# Patient Record
Sex: Female | Born: 1985 | Race: White | Hispanic: No | Marital: Married | Smoking: Never smoker
Health system: Southern US, Community
[De-identification: ages and names within clinical notes are randomized; demographics above are authoritative.]

## PROBLEM LIST (undated history)

## (undated) DIAGNOSIS — B009 Herpesviral infection, unspecified: Secondary | ICD-10-CM

## (undated) HISTORY — DX: Herpesviral infection, unspecified: B00.9

## (undated) HISTORY — PX: APPENDECTOMY: SHX54

## (undated) HISTORY — PX: TONSILLECTOMY: SUR1361

---

## 2018-11-01 ENCOUNTER — Other Ambulatory Visit: Payer: Self-pay

## 2018-11-01 ENCOUNTER — Emergency Department (HOSPITAL_COMMUNITY)
Admission: EM | Admit: 2018-11-01 | Discharge: 2018-11-01 | Disposition: A | Discharge status: Home / Self Care | Payer: BLUE CROSS/BLUE SHIELD | Attending: Emergency Medicine | Admitting: Emergency Medicine

## 2018-11-01 ENCOUNTER — Emergency Department (HOSPITAL_COMMUNITY): Payer: BLUE CROSS/BLUE SHIELD

## 2018-11-01 ENCOUNTER — Encounter (HOSPITAL_COMMUNITY): Payer: Self-pay

## 2018-11-01 DIAGNOSIS — M542 Cervicalgia: Secondary | ICD-10-CM | POA: Diagnosis not present

## 2018-11-01 DIAGNOSIS — M5410 Radiculopathy, site unspecified: Secondary | ICD-10-CM | POA: Insufficient documentation

## 2018-11-01 DIAGNOSIS — R200 Anesthesia of skin: Secondary | ICD-10-CM | POA: Diagnosis not present

## 2018-11-01 DIAGNOSIS — R202 Paresthesia of skin: Secondary | ICD-10-CM | POA: Insufficient documentation

## 2018-11-01 DIAGNOSIS — M25512 Pain in left shoulder: Secondary | ICD-10-CM | POA: Diagnosis present

## 2018-11-01 DIAGNOSIS — Z5320 Procedure and treatment not carried out because of patient's decision for unspecified reasons: Secondary | ICD-10-CM | POA: Diagnosis not present

## 2018-11-01 MED ORDER — KETOROLAC TROMETHAMINE 30 MG/ML IJ SOLN
30.0000 mg | Freq: Once | INTRAMUSCULAR | Status: AC
  Administered 2018-11-01: 20:00:00 30 mg via INTRAMUSCULAR
  Filled 2018-11-01: qty 1

## 2018-11-01 MED ORDER — PREDNISONE 20 MG PO TABS
60.0000 mg | ORAL_TABLET | Freq: Once | ORAL | Status: AC
  Administered 2018-11-01: 20:00:00 60 mg via ORAL
  Filled 2018-11-01: qty 3

## 2018-11-01 NOTE — ED Provider Notes (Signed)
[facility] Note   CSN: [id number] Arrival date & time: 11/01/18  1759    History   Chief Complaint No chief complaint on file.   HPI Terri Manning is a 33 y.o. female.     HPI   Pt is a 33 y/o female with no PMHx who presents to the ED today c/o left shoulder pain for that last 6 weeks.  States that over the last 72 hours her symptoms have seemed to become a lot worse and she now has pain that radiates up into the trapezius muscle, left chest wall, and neck.   She denies any substernal chest pain or shortness of breath.  States that it feels like a musculoskeletal pain.  Reports tingling and intermittent numbness to the left upper extremity.  States that her pain is so severe that it sometimes interferes with her daily activities.  She does feel like her left upper extremity has been weaker than normal however she has been able to do most of her activities. States that it seems like pain is limiting her activities more than weakness.   No recent falls or injuries.  No fevers. Sxs started when she started doing new exercises.   She has tried using ice, rest, ibuprofen  Pt has seen her primary clinician for this and was referred to orthopedics.  She was evaluated by Dr. Mina MarbleWeingold and was scheduled for MRI of the shoulder which has yet to take place.  Reviewed note from this encounter.  She received therapeutic/diagnostic injection of the left shoulder subacromial space at that visit.  She states that following this injection she has had no improvement of her symptoms.  No past medical history on file.  There are no active problems to display for this patient.    OB History   No obstetric history on file.      Home Medications    Prior to Admission medications   Medication Sig Start Date End Date Taking? Authorizing Provider  amphetamine-dextroamphetamine (ADDERALL) 10 MG tablet Take 10 mg by mouth daily as needed (ADHD).   09/28/18  Yes [provider]  norethindrone (MICRONOR) 0.35 MG tablet Take 1 tablet by mouth daily. 09/01/18  Yes [provider]    Family History No family history on file.  Social History Social History   Tobacco Use  . Smoking status: Never Smoker  . Smokeless tobacco: Never Used  Substance Use Topics  . Alcohol use: Yes    Alcohol/week: 2.0 standard drinks    Types: 2 Glasses of wine per week  . Drug use: Not on file     Allergies   Ceclor [cefaclor] and Erythromycin   Review of Systems Review of Systems  Constitutional: Negative for chills and fever.  HENT: Negative for ear pain and sore throat.   Eyes: Negative for visual disturbance.  Respiratory: Negative for cough and shortness of breath.   Cardiovascular: Negative for palpitations.       Chest wall pain  Gastrointestinal: Negative for abdominal pain and vomiting.  Genitourinary: Negative for dysuria and hematuria.  Musculoskeletal: Positive for neck pain.       Left shoulder/arm pain  Skin: Negative for rash.  Neurological: Positive for weakness and numbness. Negative for headaches.       Paresthesias  All other systems reviewed and are negative.    Physical Exam Updated Vital Signs BP 111/85 (BP Location: Right Arm)   Pulse 79   Temp 98.4 F (  36.9 C) (Oral)   Resp 16   Ht 5\' 9"  (1.753 m)   Wt 59 kg   LMP 10/30/2018   SpO2 100%   BMI 19.20 kg/m   Physical Exam Vitals signs and nursing note reviewed.  Constitutional:      General: She is not in acute distress.    Appearance: She is well-developed.  HENT:     Head: Normocephalic and atraumatic.  Eyes:     Conjunctiva/sclera: Conjunctivae normal.  Neck:     Musculoskeletal: Neck supple.  Cardiovascular:     Rate and Rhythm: Normal rate and regular rhythm.     Heart sounds: No murmur.  Pulmonary:     Effort: Pulmonary effort is normal. No respiratory distress.     Breath sounds: Normal breath sounds.  Abdominal:      Palpations: Abdomen is soft.     Tenderness: There is no abdominal tenderness.  Musculoskeletal:     Comments: No midline cervical spine TTP.  TTP of the left cervical paraspinous muscles and left trapezius muscles.  Also with TTP to the anterior aspect of the left shoulder.  Reports decreased sensation to dorsal and ventral aspects of the upper arm and to the ventral aspect of the forearm. Does have 5/5 strength to BUE.  Equal grip strength bilaterally.  Thumb extension and pincer strength symmetric and strong bilaterally.  Radial/ulnar pulses intact.  Skin:    General: Skin is warm and dry.  Neurological:     Mental Status: She is alert.      ED Treatments / Results  Labs (all labs ordered are listed, but only abnormal results are displayed) Labs Reviewed - No data to display  EKG EKG Interpretation  Date/Time:  Wednesday Nov 01 2018 21:06:01 EDT Ventricular Rate:  79 PR Interval:  136 QRS Duration: 86 QT Interval:  368 QTC Calculation: 421 R Axis:   77 Text Interpretation:  Normal sinus rhythm Cannot rule out Anterior infarct , age undetermined Abnormal ECG Confirmed by Pricilla Loveless [phone]) on 11/01/2018 9:14:23 PM   Radiology Dg Cervical Spine Complete  Result Date: 11/01/2018 CLINICAL DATA:  Left neck and shoulder pain over the last 6 weeks. EXAM: CERVICAL SPINE - COMPLETE 4+ VIEW COMPARISON:  None. FINDINGS: There is no evidence of cervical spine fracture or prevertebral soft tissue swelling. Alignment is normal. No other significant bone abnormalities are identified. IMPRESSION: Normal cervical spine radiographs. Electronically Signed   By: Paulina Fusi M.D.   On: 11/01/2018 20:41   Dg Shoulder Left  Result Date: 11/01/2018 CLINICAL DATA:  Left shoulder and neck pain. EXAM: LEFT SHOULDER - 2+ VIEW COMPARISON:  None. FINDINGS: There is no evidence of fracture or dislocation. There is no evidence of arthropathy or other focal bone abnormality. Soft tissues are  unremarkable. IMPRESSION: Normal Electronically Signed   By: Paulina Fusi M.D.   On: 11/01/2018 20:41    Procedures Procedures (including critical care time)  Medications Ordered in ED Medications  ketorolac (TORADOL) 30 MG/ML injection 30 mg (30 mg Intramuscular Given 11/01/18 1932)  predniSONE (DELTASONE) tablet 60 mg (60 mg Oral Given 11/01/18 1931)     Initial Impression / Assessment and Plan / ED Course  I have reviewed the triage vital signs and the nursing notes.  Pertinent labs & imaging results that were available during my care of the patient were reviewed by me and considered in my medical decision making (see chart for details).     Final Clinical Impressions(s) / ED  Diagnoses   Final diagnoses:  Radiculopathy, unspecified spinal region   Pt is a 33 y/o female c/o left shoulder pain for that last 6 weeks, worse over the last 72 hours and associated with paresthesias to the left upper extremity.  Pain radiates to the chest wall, axilla, trapezius and neck.  Reports significant pain with ADLs. Pt initially had complained of weakness, however on further questioning it seems that her activities are limited by pain rather than true weakness.  Has been seen by Ortho and is scheduled for MRI of the left shoulder in 8 days.  On exam she has FROM of the BUE without difficulty. She does have sensory changes to the LUE but I do not appreciate any weakness or asymmetry with strength. Distal pulses are intact.  We will obtain x-ray of the cervical spine and left shoulder. X-ray of the cervical spine does not show any degenerative changes or bone spurring.  Joint spaces are preserved. X-ray of the left shoulder also does not show any evidence of arthritis, bone spurring or other abnormality.  EKG obtained which showed NSR. I do not suspect cardiac/pulm etiology of sxs, this seems very MSK related.  Reassessed patient after Toradol and prednisone she states that she does feel some  improvement.  States she no longer has paresthesias, just states that she has pain to the left upper extremity.  I discussed findings of the x-rays.  I discussed the plan to give Rx for prednisone and advised her to continue anti-inflammatories.  Upon this discussion, patient became somewhat upset and was requesting MRI.  I discussed that because I am not observing any acute neurologic deficit that an emergent MRI is not indicated in the ED, and that at this time she is appropriate for outpatient follow up.  She requested to talk to my attending, Dr. Penni Homans. I informed SP who agreed to see patient. On returning to her room to inform pt of this, she had eloped from the ED.  ED Discharge Orders    None       Rayne Du 11/01/18 2138    Pricilla Loveless, MD 11/02/18 1626

## 2018-11-01 NOTE — ED Triage Notes (Signed)
6 week hx of L shoulder pain radiating into chest and progressive numbness and tingling, difficulty holding things and decreased ROM.  Burning pain to elbow and forearm intermittently.  Used ice, rest and ibu without relief.  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  Denies SOB, no N/V/D, no cough.

## 2018-11-01 NOTE — ED Notes (Signed)
Patient eloped

## 2018-11-09 ENCOUNTER — Other Ambulatory Visit: Payer: Self-pay

## 2018-11-09 ENCOUNTER — Ambulatory Visit (INDEPENDENT_AMBULATORY_CARE_PROVIDER_SITE_OTHER): Payer: BLUE CROSS/BLUE SHIELD | Admitting: Neurology

## 2018-11-09 DIAGNOSIS — R202 Paresthesia of skin: Secondary | ICD-10-CM | POA: Diagnosis not present

## 2018-11-09 NOTE — Procedures (Signed)
[facility] Neurology  [address] [place], [address]  [place], [place] [address] Tel: [phone] Fax:  [phone] Test Date:  11/09/2018  Patient: Terri Manning DOB: 07/15/1985 Physician: Nita Sickle, DO  Sex: Female Height: 5\' 9"  Ref Phys: Dairl Ponder, MD  ID#: [id number] Temp: 33.0C Technician:    Patient Complaints: This is a 33 year old female with left arm pain and paresthesias concerning for brachial plexus injury.  NCV & EMG Findings: Extensive electrodiagnostic testing of the left upper extremity and additional studies of the right shows:  1. All sensory responses including left median, left ulnar, bilateral radial, bilateral medial and lateral antebrachial cutaneous sensory responses are symmetric and within normal limits. 2. Left median and ulnar motor responses are within normal limits 3. There is no evidence of active or chronic motor axonal loss changes affecting any of the tested muscles.  Motor unit configuration and recruitment pattern is within normal limits.  Impression: This is a normal study of the left upper extremity.  In particular, there is no evidence of a brachial plexopathy, cervical radiculopathy, or entrapment neuropathy.   ___________________________ Nita Sickle, DO    Nerve Conduction Studies Anti Sensory Summary Table   Site NR Peak (ms) Norm Peak (ms) P-T Amp (V) Norm P-T Amp  Left Lat Ante Brach Cutan Anti Sensory (Lat Forearm)  Lat Biceps    1.5 <2.9 35.1 >14  Right Lat Ante Brach Cutan Anti Sensory (Lat Forearm)  Lat Biceps    2.0 <2.9 27.9 >14  Left Med Ante Brach Cutan Anti Sensory (Med Forearm)  Elbow    1.8  21.0   Right Med Ante Brach Cutan Anti Sensory (Med Forearm)  Elbow    2.1  24.5   Left Median Anti Sensory (2nd Digit)  Wrist    3.1 <3.4 57.6 >20  Left Radial Anti Sensory (Base 1st Digit)  Wrist    2.1 <2.7 48.1 >18  Right Radial Anti Sensory (Base 1st Digit)  Wrist    2.6 <2.7 49.0 >18  Left Ulnar Anti Sensory (5th  Digit)  Wrist    2.9 <3.1 54.5 >12   Motor Summary Table   Site NR Onset (ms) Norm Onset (ms) O-P Amp (mV) Norm O-P Amp Site1 Site2 Delta-0 (ms) Dist (cm) Vel (m/s) Norm Vel (m/s)  Left Median Motor (Abd Poll Brev)  Wrist    3.0 <3.9 9.5 >6 Elbow Wrist 4.5 27.0 60 >50  Elbow    7.5  9.3         Left Ulnar Motor (Abd Dig Minimi)  Wrist    2.3 <3.1 10.8 >7 B Elbow Wrist 3.8 23.0 61 >50  B Elbow    6.1  10.5  A Elbow B Elbow 1.8 10.0 56 >50  A Elbow    7.9  10.5          EMG   Side Muscle Ins Act Fibs Psw Fasc Number Recrt Dur Dur. Amp Amp. Poly Poly. Comment  Left 1stDorInt Nml Nml Nml Nml Nml Nml Nml Nml Nml Nml Nml Nml N/A  Left Ext Indicis Nml Nml Nml Nml Nml Nml Nml Nml Nml Nml Nml Nml N/A  Left PronatorTeres Nml Nml Nml Nml Nml Nml Nml Nml Nml Nml Nml Nml N/A  Left Biceps Nml Nml Nml Nml Nml Nml Nml Nml Nml Nml Nml Nml N/A  Left Triceps Nml Nml Nml Nml Nml Nml Nml Nml Nml Nml Nml Nml N/A  Left Deltoid Nml Nml Nml Nml Nml Nml Nml  Nml Nml Nml Nml Nml N/A  Left Infraspinatus Nml Nml Nml Nml Nml Nml Nml Nml Nml Nml Nml Nml N/A  Left Cervical Parasp Low Nml Nml Nml Nml Nml Nml Nml Nml Nml Nml Nml Nml N/A      Waveforms:

## 2018-11-10 ENCOUNTER — Other Ambulatory Visit: Payer: Self-pay

## 2018-11-10 DIAGNOSIS — R202 Paresthesia of skin: Secondary | ICD-10-CM

## 2018-12-18 ENCOUNTER — Telehealth: Payer: Self-pay | Admitting: Neurology

## 2018-12-18 NOTE — Telephone Encounter (Signed)
Nurse from Dr. Bertis Ruddy office is needing the results faxed to their office for the May EMG. Fax #: [phone]. Thanks!

## 2018-12-18 NOTE — Telephone Encounter (Signed)
Procedure note from 11/09/18 faxed to Dr. Bertis Ruddy office.

## 2018-12-29 ENCOUNTER — Other Ambulatory Visit: Payer: Self-pay

## 2018-12-29 DIAGNOSIS — Z20822 Contact with and (suspected) exposure to covid-19: Secondary | ICD-10-CM

## 2019-01-04 LAB — NOVEL CORONAVIRUS, NAA: SARS-CoV-2, NAA: NOT DETECTED

## 2020-02-22 ENCOUNTER — Other Ambulatory Visit: Payer: Self-pay

## 2020-02-22 ENCOUNTER — Emergency Department (HOSPITAL_COMMUNITY)
Admission: EM | Admit: 2020-02-22 | Discharge: 2020-02-23 | Disposition: A | Discharge status: Home / Self Care | Payer: BC Managed Care – PPO | Attending: Emergency Medicine | Admitting: Emergency Medicine

## 2020-02-22 DIAGNOSIS — Z5321 Procedure and treatment not carried out due to patient leaving prior to being seen by health care provider: Secondary | ICD-10-CM | POA: Insufficient documentation

## 2020-02-22 DIAGNOSIS — R079 Chest pain, unspecified: Secondary | ICD-10-CM | POA: Insufficient documentation

## 2020-02-22 DIAGNOSIS — R5383 Other fatigue: Secondary | ICD-10-CM | POA: Diagnosis not present

## 2020-02-22 DIAGNOSIS — O99893 Other specified diseases and conditions complicating puerperium: Secondary | ICD-10-CM | POA: Diagnosis not present

## 2020-02-22 DIAGNOSIS — Z3A34 34 weeks gestation of pregnancy: Secondary | ICD-10-CM | POA: Insufficient documentation

## 2020-02-22 LAB — CBC
HCT: 37 % (ref 36.0–46.0)
Hemoglobin: 12.3 g/dL (ref 12.0–15.0)
MCH: 32 pg (ref 26.0–34.0)
MCHC: 33.2 g/dL (ref 30.0–36.0)
MCV: 96.4 fL (ref 80.0–100.0)
Platelets: 156 10*3/uL (ref 150–400)
RBC: 3.84 MIL/uL — ABNORMAL LOW (ref 3.87–5.11)
RDW: 12.3 % (ref 11.5–15.5)
WBC: 4 10*3/uL (ref 4.0–10.5)
nRBC: 0 % (ref 0.0–0.2)

## 2020-02-22 LAB — BASIC METABOLIC PANEL
Anion gap: 13 (ref 5–15)
BUN: 6 mg/dL (ref 6–20)
CO2: 20 mmol/L — ABNORMAL LOW (ref 22–32)
Calcium: 9 mg/dL (ref 8.9–10.3)
Chloride: 104 mmol/L (ref 98–111)
Creatinine, Ser: 0.68 mg/dL (ref 0.44–1.00)
GFR calc Af Amer: >60 mL/min (ref >60)
GFR calc non Af Amer: >60 mL/min (ref >60)
Glucose, Bld: 124 mg/dL — ABNORMAL HIGH (ref 70–99)
Potassium: 3.2 mmol/L — ABNORMAL LOW (ref 3.5–5.1)
Sodium: 137 mmol/L (ref 135–145)

## 2020-02-22 LAB — TROPONIN I (HIGH SENSITIVITY): Troponin I (High Sensitivity): <2 ng/L (ref <18)

## 2020-02-22 NOTE — ED Triage Notes (Signed)
Pt reports generalized chest pain since 1200 today. Pain worse when lying down to take a nap and then was worse after waking up. Thinks pain is muscular, as it is painful with inspiration. Reports feeling more fatigued and headache the last two days. [redacted] weeks pregnant.

## 2020-02-22 NOTE — ED Notes (Signed)
Pt called x3 for labs and vitals, no answer.

## 2020-06-21 NOTE — L&D Delivery Note (Signed)
Delivery Note Pt progressed  nicely to C/C/+2.  After a brief 2nd stage, at 1:33 AM a viable female was delivered via Vaginal, Spontaneous (Presentation:   OA   ) in the water.  APGAR: 8, 8; weight pending.   After about 3-4 minutes, the cord was doubly clamped and cut.  Several minutes later, pt assisted out of the tub to the bed.   Placenta status: Spontaneous, Intact.  Cord: 3 vessels with the following complications: None.    Anesthesia: None Episiotomy: None Lacerations: 1st degree Suture Repair:  Est. Blood Loss (mL): 150  Mom to postpartum.  Baby to Couplet care / Skin to Skin  The above by Dr. Karyl Kinnier under my direct supervision.  Terri Manning 05/29/2021, 1:56 AM

## 2020-12-30 LAB — OB RESULTS CONSOLE RUBELLA ANTIBODY, IGM: Rubella: IMMUNE

## 2020-12-30 LAB — OB RESULTS CONSOLE RPR: RPR: NONREACTIVE

## 2021-01-01 LAB — HEPATITIS C ANTIBODY: HCV Ab: NEGATIVE

## 2021-01-01 LAB — HEPATITIS B SURFACE ANTIGEN: Hepatitis B Surface Ag: NEGATIVE

## 2021-01-01 LAB — HM HIV SCREENING LAB: HM HIV Screening: NEGATIVE

## 2021-01-01 LAB — OB RESULTS CONSOLE GC/CHLAMYDIA: Chlamydia: NEGATIVE

## 2021-01-01 LAB — HM HEPATITIS C SCREENING LAB: HM Hepatitis Screen: NEGATIVE

## 2021-01-01 LAB — OB RESULTS CONSOLE HIV ANTIBODY (ROUTINE TESTING): HIV: NONREACTIVE

## 2021-04-07 ENCOUNTER — Ambulatory Visit (INDEPENDENT_AMBULATORY_CARE_PROVIDER_SITE_OTHER): Payer: Medicaid Other | Admitting: Advanced Practice Midwife

## 2021-04-07 ENCOUNTER — Other Ambulatory Visit: Payer: Self-pay

## 2021-04-07 VITALS — BP 112/72 | HR 86 | Wt 170.0 lb

## 2021-04-07 DIAGNOSIS — A6009 Herpesviral infection of other urogenital tract: Secondary | ICD-10-CM | POA: Diagnosis not present

## 2021-04-07 DIAGNOSIS — O98313 Other infections with a predominantly sexual mode of transmission complicating pregnancy, third trimester: Secondary | ICD-10-CM | POA: Diagnosis not present

## 2021-04-07 DIAGNOSIS — Z3480 Encounter for supervision of other normal pregnancy, unspecified trimester: Secondary | ICD-10-CM | POA: Insufficient documentation

## 2021-04-07 DIAGNOSIS — Z3A30 30 weeks gestation of pregnancy: Secondary | ICD-10-CM

## 2021-04-07 DIAGNOSIS — O0990 Supervision of high risk pregnancy, unspecified, unspecified trimester: Secondary | ICD-10-CM | POA: Insufficient documentation

## 2021-04-07 DIAGNOSIS — Z8759 Personal history of other complications of pregnancy, childbirth and the puerperium: Secondary | ICD-10-CM | POA: Diagnosis not present

## 2021-04-07 DIAGNOSIS — O0993 Supervision of high risk pregnancy, unspecified, third trimester: Secondary | ICD-10-CM

## 2021-04-07 DIAGNOSIS — O98319 Other infections with a predominantly sexual mode of transmission complicating pregnancy, unspecified trimester: Secondary | ICD-10-CM | POA: Insufficient documentation

## 2021-04-07 NOTE — Progress Notes (Signed)
Subjective:   Terri Manning is a 35 y.o. [id number] at [redacted]w[redacted]d by midtrimester ultrasound being seen today for her first obstetrical visit.  Her obstetrical history is significant for  NSVD with first pregnancy with macrosomia 10+ lbs, second baby 8lbs without complication.  and has Supervision of high risk pregnancy, antepartum; History of delivery of macrosomal infant; and Genital herpes affecting pregnancy, antepartum on their problem list.. Patient does intend to breast feed. Pregnancy history fully reviewed.  Patient reports no complaints.  HISTORY: OB History  Gravida Para Term Preterm AB Living  4 2 2  0 1 2  SAB IAB Ectopic Multiple Live Births  1 0 0 0 2    # Outcome Date GA Lbr Len/2nd Weight Sex Delivery Anes PTL Lv  4 Current           3 Term 04/17/20 [redacted]w[redacted]d  8 lb (3.629 kg) M Vag-Spont EPI N LIV  2 Term 10/02/12 [redacted]w[redacted]d  10 lb (4.536 kg) M Vag-Spont EPI N LIV  1 SAB            No past medical history on file.  No family history on file. Social History   Tobacco Use   Smoking status: Never   Smokeless tobacco: Never  Substance Use Topics   Alcohol use: Yes    Alcohol/week: 2.0 standard drinks    Types: 2 Glasses of wine per week   Allergies  Allergen Reactions   Ceclor [Cefaclor] Nausea And Vomiting    hallucinations   Erythromycin Hives   Current Outpatient Medications on File Prior to Visit  Medication Sig Dispense Refill   amphetamine-dextroamphetamine (ADDERALL) 10 MG tablet Take 10 mg by mouth daily as needed (ADHD).      norethindrone (MICRONOR) 0.35 MG tablet Take 1 tablet by mouth daily. (Patient not taking: Reported on 04/07/2021)     valACYclovir (VALTREX) 500 MG tablet Take 500 mg by mouth daily. To start at 36 wks (Patient not taking: Reported on 04/07/2021)     No current facility-administered medications on file prior to visit.   Exam   Vitals:   04/07/21 1450  BP: 112/72  Pulse: 86  Weight: 170 lb (77.1 kg)      VS reviewed, nursing  note reviewed,  Constitutional: well developed, well nourished, no distress HEENT: normocephalic CV: normal rate Pulm/chest wall: normal effort Abdomen: soft Neuro: alert and oriented x 3 Skin: warm, dry Psych: affect normal    Assessment:   Pregnancy: 04/09/21 Patient Active Problem List   Diagnosis Date Noted   Supervision of high risk pregnancy, antepartum 04/07/2021   History of delivery of macrosomal infant 04/07/2021   Genital herpes affecting pregnancy, antepartum 04/07/2021     Plan:  1. Supervision of high risk pregnancy, antepartum --Anticipatory guidance about next visits/weeks of pregnancy given. --Next visit in 2 weeks  2. History of delivery of macrosomal infant --First baby 10+ lbs with uncomplicated vaginal delivery, second baby 8 lbs also without complication.   3. Genital herpes affecting pregnancy, antepartum --Last outbreak several years ago. Pt has Rx for Valtrex from [facility] and will take at 36 weeks to prevent outbreaks for delivery.   Reviewed labs from [facility] Continue prenatal vitamins. Reviewed prior genetic screening and 04/09/2021 Problem list reviewed and updated. The nature of [facility] with multiple MDs and other Advanced Practice Providers was explained to patient; also emphasized that residents, students are part of our team. Routine obstetric precautions reviewed.  No follow-ups on file.   Sharen Counter, CNM 04/07/21 5:31 PM

## 2021-04-07 NOTE — Progress Notes (Signed)
OB Transfer 33.6wks Hep C not done at [facility], pended TDAP and flu offered and declined  No complaints.

## 2021-04-22 ENCOUNTER — Encounter: Payer: Medicaid Other | Admitting: Women's Health

## 2021-04-23 ENCOUNTER — Ambulatory Visit (INDEPENDENT_AMBULATORY_CARE_PROVIDER_SITE_OTHER): Payer: Medicaid Other

## 2021-04-23 ENCOUNTER — Other Ambulatory Visit: Payer: Self-pay

## 2021-04-23 ENCOUNTER — Other Ambulatory Visit (HOSPITAL_COMMUNITY)
Admission: RE | Admit: 2021-04-23 | Discharge: 2021-04-23 | Disposition: A | Discharge status: Home / Self Care | Payer: Medicaid Other | Source: Ambulatory Visit

## 2021-04-23 VITALS — BP 106/67 | HR 82 | Wt 171.0 lb

## 2021-04-23 DIAGNOSIS — A6009 Herpesviral infection of other urogenital tract: Secondary | ICD-10-CM

## 2021-04-23 DIAGNOSIS — O4402 Placenta previa specified as without hemorrhage, second trimester: Secondary | ICD-10-CM | POA: Insufficient documentation

## 2021-04-23 DIAGNOSIS — Z3A36 36 weeks gestation of pregnancy: Secondary | ICD-10-CM

## 2021-04-23 DIAGNOSIS — Z3480 Encounter for supervision of other normal pregnancy, unspecified trimester: Secondary | ICD-10-CM

## 2021-04-23 DIAGNOSIS — M6208 Separation of muscle (nontraumatic), other site: Secondary | ICD-10-CM

## 2021-04-23 DIAGNOSIS — O0990 Supervision of high risk pregnancy, unspecified, unspecified trimester: Secondary | ICD-10-CM | POA: Insufficient documentation

## 2021-04-23 DIAGNOSIS — O98319 Other infections with a predominantly sexual mode of transmission complicating pregnancy, unspecified trimester: Secondary | ICD-10-CM

## 2021-04-23 DIAGNOSIS — O43199 Other malformation of placenta, unspecified trimester: Secondary | ICD-10-CM | POA: Insufficient documentation

## 2021-04-23 LAB — OB RESULTS CONSOLE GC/CHLAMYDIA: Gonorrhea: NEGATIVE

## 2021-04-23 MED ORDER — VALACYCLOVIR HCL 500 MG PO TABS: 500.0000 mg | ORAL_TABLET | Freq: Two times a day (BID) | ORAL | 2 refills | Status: DC

## 2021-04-23 NOTE — Progress Notes (Signed)
ROB GBS 

## 2021-04-23 NOTE — Progress Notes (Signed)
LOW-RISK PREGNANCY OFFICE VISIT  Patient name: Terri Manning MRN [id number]  Date of birth: 1985-07-21 Chief Complaint:   Routine Prenatal Visit (GBS)  Subjective:   Terri Manning is a 35 y.o. [phone] female at [redacted]w[redacted]d with an Estimated Date of Delivery: 05/20/21 being seen today for ongoing management of a Low-risk pregnancy aeb has Supervision of high risk pregnancy, antepartum; History of delivery of macrosomal infant; and Genital herpes affecting pregnancy, antepartum on their problem list.  Patient presents today with  symphis pubis pain .  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 She wears a pregnancy belt.  Patient endorses fetal movement. Patient denies abdominal cramping or contractions.   Patient denies vaginal concerns including abnormal discharge, leaking of fluid, and bleeding.  Contractions: Not present. Vag. Bleeding: None.  Movement: Present.  Reviewed past medical,surgical, social, obstetrical and family history as well as problem list, medications and allergies.  Objective   Vitals:   04/23/21 1015  BP: 106/67  Pulse: 82  Weight: 171 lb (77.6 kg)  Body mass index is 25.25 kg/m.  Total Weight Gain:41 lb (18.6 kg)         Physical Examination:   General appearance: Well appearing, and in no distress  Mental status: Alert, oriented to person, place, and time  Skin: Warm & dry  Cardiovascular: Normal heart rate noted  Respiratory: Normal respiratory effort, no distress  Abdomen: Soft, gravid, nontender, AGA with    Pelvic: Cervical exam deferred           Extremities: Edema: None  Fetal Status: Fetal Heart Rate (bpm): 145  Movement: Present   No results found for this or any previous visit (from the past 24 hour(s)).  Assessment & Plan:  Low-risk pregnancy of a 35 y.o., [id number] at [redacted]w[redacted]d with an Estimated Date  of Delivery: 05/20/21   1. Supervision of other normal pregnancy, antepartum -Anticipatory guidance for upcoming appts. -Patient to schedule next appt in 1 weeks for an in-person visit visit. -Review of previous records shows history of placenta previa that resolved at 23 weeks and documented MCI as of 31 weeks. -Will place recommendation for [place] to be discussed at next visit. -No documentation of WB course completion in chart. Provider did not solicit.   2. [redacted] weeks gestation of pregnancy -Addressed concerns. -Discussed some interventions for abdominal and pubis discomfort including warm compresses, rest, and tylenol  usage as needed. -Informed that relief will come after delivery. -Reassured that current symptoms are expected at this stage in pregnancy.  3. Genital herpes affecting pregnancy, antepartum -Rx for Valtrex sent to pharmacy on file.      Meds:  Meds ordered this encounter  Medications   valACYclovir (VALTREX) 500 MG tablet    Sig: Take 1 tablet (500 mg total) by mouth 2 (two) times daily.    Dispense:  90 tablet    Refill:  2    Order Specific Question:   Supervising Provider    Answer:   Reva Bores [[id number]]   Labs/procedures today:  Lab Orders         Culture, beta strep (group b only)       Reviewed: Term labor symptoms and general obstetric precautions including but not limited to vaginal bleeding, contractions, leaking of fluid and fetal movement were reviewed in detail with the patient.  All questions  were answered.  Follow-up: Return in about 1 week (around 04/30/2021) for LROB.  Orders Placed This Encounter  Procedures   Culture, beta strep (group b only)   Cherre Robins MSN, CNM 04/23/2021

## 2021-04-24 LAB — CERVICOVAGINAL ANCILLARY ONLY
Chlamydia: NEGATIVE
Comment: NEGATIVE
Comment: NORMAL
Neisseria Gonorrhea: NEGATIVE

## 2021-04-27 LAB — CULTURE, BETA STREP (GROUP B ONLY): Strep Gp B Culture: NEGATIVE

## 2021-05-01 ENCOUNTER — Other Ambulatory Visit: Payer: Self-pay

## 2021-05-01 ENCOUNTER — Ambulatory Visit (INDEPENDENT_AMBULATORY_CARE_PROVIDER_SITE_OTHER): Payer: Medicaid Other | Admitting: Certified Nurse Midwife

## 2021-05-01 VITALS — BP 116/69 | HR 88 | Wt 175.2 lb

## 2021-05-01 DIAGNOSIS — Z3493 Encounter for supervision of normal pregnancy, unspecified, third trimester: Secondary | ICD-10-CM

## 2021-05-01 DIAGNOSIS — Z3A37 37 weeks gestation of pregnancy: Secondary | ICD-10-CM

## 2021-05-01 DIAGNOSIS — Z3483 Encounter for supervision of other normal pregnancy, third trimester: Secondary | ICD-10-CM

## 2021-05-01 NOTE — Progress Notes (Signed)
   PRENATAL VISIT NOTE  Subjective:  Terri Manning is a 35 y.o. [phone] at [redacted]w[redacted]d being seen today for ongoing prenatal care.  She is currently monitored for the following issues for this low-risk pregnancy and has Supervision of other normal pregnancy, antepartum; History of delivery of macrosomal infant; Genital herpes affecting pregnancy, antepartum; Marginal insertion of umbilical cord affecting management of mother; and Placenta previa antepartum in second trimester on their problem list.  Patient reports no complaints.  Contractions: Irritability. Vag. Bleeding: None.  Movement: Present. Denies leaking of fluid.   The following portions of the patient's history were reviewed and updated as appropriate: allergies, current medications, past family history, past medical history, past social history, past surgical history and problem list.   Objective:   Vitals:   05/01/21 0911  BP: 116/69  Pulse: 88  Weight: 175 lb 3.2 oz (79.5 kg)    Fetal Status: Fetal Heart Rate (bpm): 135 Fundal Height: 37 cm Movement: Present     General:  Alert, oriented and cooperative. Patient is in no acute distress.  Skin: Skin is warm and dry. No rash noted.   Cardiovascular: Normal heart rate noted  Respiratory: Normal respiratory effort, no problems with respiration noted  Abdomen: Soft, gravid, appropriate for gestational age.  Pain/Pressure: Present     Pelvic: Cervical exam deferred        Extremities: Normal range of motion.  Edema: None  Mental Status: Normal mood and affect. Normal behavior. Normal judgment and thought content.   Assessment and Plan:  Pregnancy: [id number] at [redacted]w[redacted]d 1. Supervision of low-risk pregnancy, third trimester - Doing well, feeling regular and vigorous fetal movement   2. [redacted] weeks gestation of pregnancy - Routine OB care - Waterbirth consent completed, discussed possible concern about fetal weight but pt has no history of SD even with the 10lb6oz first baby. She's  pushed for less than with both previous deliveries.  Term labor symptoms and general obstetric precautions including but not limited to vaginal bleeding, contractions, leaking of fluid and fetal movement were reviewed in detail with the patient. Please refer to After Visit Summary for other counseling recommendations.   Return in about 1 week (around 05/08/2021) for IN-PERSON, LOB.  Future Appointments  Date Time Provider Department Center  05/07/2021  3:30 PM Conan Bowens, MD [facility] None    Bernerd Limbo, CNM

## 2021-05-07 ENCOUNTER — Ambulatory Visit (INDEPENDENT_AMBULATORY_CARE_PROVIDER_SITE_OTHER): Payer: Medicaid Other | Admitting: Obstetrics and Gynecology

## 2021-05-07 ENCOUNTER — Encounter: Payer: Self-pay | Admitting: Obstetrics and Gynecology

## 2021-05-07 ENCOUNTER — Other Ambulatory Visit: Payer: Self-pay

## 2021-05-07 VITALS — BP 113/70 | HR 85 | Wt 178.0 lb

## 2021-05-07 DIAGNOSIS — Z3A38 38 weeks gestation of pregnancy: Secondary | ICD-10-CM

## 2021-05-07 DIAGNOSIS — A6009 Herpesviral infection of other urogenital tract: Secondary | ICD-10-CM

## 2021-05-07 DIAGNOSIS — Z3480 Encounter for supervision of other normal pregnancy, unspecified trimester: Secondary | ICD-10-CM

## 2021-05-07 DIAGNOSIS — O98319 Other infections with a predominantly sexual mode of transmission complicating pregnancy, unspecified trimester: Secondary | ICD-10-CM

## 2021-05-07 DIAGNOSIS — Z8759 Personal history of other complications of pregnancy, childbirth and the puerperium: Secondary | ICD-10-CM

## 2021-05-07 DIAGNOSIS — O43199 Other malformation of placenta, unspecified trimester: Secondary | ICD-10-CM

## 2021-05-07 NOTE — Progress Notes (Signed)
   PRENATAL VISIT NOTE  Subjective:  Terri Manning is a 35 y.o. [phone] at [redacted]w[redacted]d being seen today for ongoing prenatal care.  She is currently monitored for the following issues for this low-risk pregnancy and has Supervision of other normal pregnancy, antepartum; History of delivery of macrosomal infant; Genital herpes affecting pregnancy, antepartum; Marginal insertion of umbilical cord affecting management of mother; and Placenta previa antepartum in second trimester on their problem list.  Patient reports occasional contractions.  Contractions: Irregular. Vag. Bleeding: None.  Movement: Present. Denies leaking of fluid.   The following portions of the patient's history were reviewed and updated as appropriate: allergies, current medications, past family history, past medical history, past social history, past surgical history and problem list.   Objective:   Vitals:   05/07/21 1537  BP: 113/70  Pulse: 85  Weight: 178 lb (80.7 kg)    Fetal Status: Fetal Heart Rate (bpm): 150 Fundal Height: 37 cm Movement: Present     General:  Alert, oriented and cooperative. Patient is in no acute distress.  Skin: Skin is warm and dry. No rash noted.   Cardiovascular: Normal heart rate noted  Respiratory: Normal respiratory effort, no problems with respiration noted  Abdomen: Soft, gravid, appropriate for gestational age.  Pain/Pressure: Present     Pelvic: Cervical exam deferred        Extremities: Normal range of motion.  Edema: None  Mental Status: Normal mood and affect. Normal behavior. Normal judgment and thought content.   Assessment and Plan:  Pregnancy: [id number] at [redacted]w[redacted]d  1. History of delivery of macrosomal infant  2. Marginal insertion of umbilical cord affecting management of mother  3. Genital herpes affecting pregnancy, antepartum On valtrex, no symptoms/lesions  4. Supervision of other normal pregnancy, antepartum  5. [redacted] weeks gestation of pregnancy   Term labor  symptoms and general obstetric precautions including but not limited to vaginal bleeding, contractions, leaking of fluid and fetal movement were reviewed in detail with the patient. Please refer to After Visit Summary for other counseling recommendations.   Return in about 1 week (around 05/14/2021) for low OB, in person.  No future appointments.  Conan Bowens, MD

## 2021-05-18 ENCOUNTER — Other Ambulatory Visit: Payer: Self-pay

## 2021-05-18 ENCOUNTER — Ambulatory Visit (INDEPENDENT_AMBULATORY_CARE_PROVIDER_SITE_OTHER): Payer: Medicaid Other | Admitting: Advanced Practice Midwife

## 2021-05-18 VITALS — BP 120/72 | HR 93 | Wt 176.0 lb

## 2021-05-18 DIAGNOSIS — Z3480 Encounter for supervision of other normal pregnancy, unspecified trimester: Secondary | ICD-10-CM

## 2021-05-18 DIAGNOSIS — Z3A39 39 weeks gestation of pregnancy: Secondary | ICD-10-CM

## 2021-05-18 DIAGNOSIS — O43199 Other malformation of placenta, unspecified trimester: Secondary | ICD-10-CM

## 2021-05-18 DIAGNOSIS — Z8759 Personal history of other complications of pregnancy, childbirth and the puerperium: Secondary | ICD-10-CM

## 2021-05-18 NOTE — Progress Notes (Signed)
   PRENATAL VISIT NOTE  Subjective:  Terri Manning is a 35 y.o. [phone] at [redacted]w[redacted]d being seen today for ongoing prenatal care.  She is currently monitored for the following issues for this low-risk pregnancy and has Supervision of other normal pregnancy, antepartum; History of delivery of macrosomal infant; Genital herpes affecting pregnancy, antepartum; Marginal insertion of umbilical cord affecting management of mother; and Placenta previa antepartum in second trimester on their problem list.  Patient reports no complaints.  Contractions: Not present. Vag. Bleeding: None.  Movement: Present. Denies leaking of fluid.   The following portions of the patient's history were reviewed and updated as appropriate: allergies, current medications, past family history, past medical history, past social history, past surgical history and problem list.   Objective:   Vitals:   05/18/21 1338  BP: 120/72  Pulse: 93  Weight: 176 lb (79.8 kg)    Fetal Status: Fetal Heart Rate (bpm): 138 Fundal Height: 38 cm Movement: Present  Presentation: Vertex  General:  Alert, oriented and cooperative. Patient is in no acute distress.  Skin: Skin is warm and dry. No rash noted.   Cardiovascular: Normal heart rate noted  Respiratory: Normal respiratory effort, no problems with respiration noted  Abdomen: Soft, gravid, appropriate for gestational age.  Pain/Pressure: Present     Pelvic: Cervical exam performed in the presence of a chaperone Dilation: 1 Effacement (%): 50 Station: -2  Extremities: Normal range of motion.  Edema: None  Mental Status: Normal mood and affect. Normal behavior. Normal judgment and thought content.   Assessment and Plan:  Pregnancy: [id number] at [redacted]w[redacted]d 1. Supervision of other normal pregnancy, antepartum --Anticipatory guidance about next visits/weeks of pregnancy given. --No barriers to WB --Next visit at 40 weeks for NST, pt Ok with membrane sweep next week, declines today --Visit in  1 week  2. Marginal insertion of umbilical cord affecting management of mother --Growth wnl at 23 and 31 weeks, declined further scans  3. History of delivery of macrosomal infant --first baby 10 lbs, uncomplicated delivery  4. [redacted] weeks gestation of pregnancy   Term labor symptoms and general obstetric precautions including but not limited to vaginal bleeding, contractions, leaking of fluid and fetal movement were reviewed in detail with the patient. Please refer to After Visit Summary for other counseling recommendations.   No follow-ups on file.  Future Appointments  Date Time Provider Department Center  05/25/2021  1:30 PM Leftwich-Kirby, Wilmer Floor, CNM [facility] None     Sharen Counter, CNM

## 2021-05-25 ENCOUNTER — Ambulatory Visit (INDEPENDENT_AMBULATORY_CARE_PROVIDER_SITE_OTHER): Payer: Medicaid Other | Admitting: Advanced Practice Midwife

## 2021-05-25 ENCOUNTER — Other Ambulatory Visit: Payer: Self-pay

## 2021-05-25 VITALS — BP 123/76 | HR 81 | Wt 175.0 lb

## 2021-05-25 DIAGNOSIS — O98313 Other infections with a predominantly sexual mode of transmission complicating pregnancy, third trimester: Secondary | ICD-10-CM

## 2021-05-25 DIAGNOSIS — O480 Post-term pregnancy: Secondary | ICD-10-CM

## 2021-05-25 DIAGNOSIS — O43193 Other malformation of placenta, third trimester: Secondary | ICD-10-CM

## 2021-05-25 DIAGNOSIS — A6009 Herpesviral infection of other urogenital tract: Secondary | ICD-10-CM

## 2021-05-25 DIAGNOSIS — O98319 Other infections with a predominantly sexual mode of transmission complicating pregnancy, unspecified trimester: Secondary | ICD-10-CM

## 2021-05-25 DIAGNOSIS — Z3480 Encounter for supervision of other normal pregnancy, unspecified trimester: Secondary | ICD-10-CM

## 2021-05-25 DIAGNOSIS — O43199 Other malformation of placenta, unspecified trimester: Secondary | ICD-10-CM

## 2021-05-25 DIAGNOSIS — Z3A40 40 weeks gestation of pregnancy: Secondary | ICD-10-CM

## 2021-05-25 NOTE — Progress Notes (Signed)
Pt states ctx have picked over the past couple days.

## 2021-05-25 NOTE — Patient Instructions (Signed)
Labor Precautions Reasons to come to [facility] at [facility]:  1.  Contractions are  5 minutes apart or less, each last 1 minute, these have been going on for 1-2 hours, and you cannot walk or talk during them 2.  You have a large gush of fluid, or a trickle of fluid that will not stop and you have to wear a pad 3.  You have bleeding that is bright red, heavier than spotting--like menstrual bleeding (spotting can be normal in early labor or after a check of your cervix) 4.  You do not feel the baby moving like he/she normally does  

## 2021-05-25 NOTE — Progress Notes (Signed)
   PRENATAL VISIT NOTE  Subjective:  Terri Manning is a 35 y.o. [phone] at [redacted]w[redacted]d being seen today for ongoing prenatal care.  She is currently monitored for the following issues for this low-risk pregnancy and has Supervision of other normal pregnancy, antepartum; History of delivery of macrosomal infant; Genital herpes affecting pregnancy, antepartum; Marginal insertion of umbilical cord affecting management of mother; and Placenta previa antepartum in second trimester on their problem list.  Patient reports  stronger but irregular contractions starting yesterday and continuing today .  Contractions: Irregular. Vag. Bleeding: None.  Movement: Present. Denies leaking of fluid.   The following portions of the patient's history were reviewed and updated as appropriate: allergies, current medications, past family history, past medical history, past social history, past surgical history and problem list.   Objective:   Vitals:   05/25/21 1329  BP: 123/76  Pulse: 81  Weight: 175 lb (79.4 kg)    Fetal Status: Fetal Heart Rate (bpm): NST   Movement: Present  Presentation: Vertex  General:  Alert, oriented and cooperative. Patient is in no acute distress.  Skin: Skin is warm and dry. No rash noted.   Cardiovascular: Normal heart rate noted  Respiratory: Normal respiratory effort, no problems with respiration noted  Abdomen: Soft, gravid, appropriate for gestational age.  Pain/Pressure: Present     Pelvic: Cervical exam performed in the presence of a chaperone Dilation: 3 Effacement (%): 50 Station: -2  Extremities: Normal range of motion.  Edema: None  Mental Status: Normal mood and affect. Normal behavior. Normal judgment and thought content.   Assessment and Plan:  Pregnancy: [id number] at [redacted]w[redacted]d 1. Supervision of other normal pregnancy, antepartum --Anticipatory guidance about next visits/weeks of pregnancy given. --Pt does not desire 41 week IOL --NST reactive today and pt feeling good  fetal movement --Next visit for NST later this week, then prenatal visit in 1 week --Pt declines membrane sweep today, but cervix now 3 cm, change from last week, and pt with more painful irregular contractions so hoping labor is within a few days --Next visit will be at [redacted]w[redacted]d, so will need to schedule induction at that time, pt states understanding and agrees with this plan --Kick counts/warning signs/labor precautions/reasons to go to hospital reviewed  2. Marginal insertion of umbilical cord affecting management of mother --Normal growth [place]  3. Genital herpes affecting pregnancy, antepartum --On suppression  4. Post term pregnancy over 40 weeks --NST reactive  5. [redacted] weeks gestation of pregnancy   Term labor symptoms and general obstetric precautions including but not limited to vaginal bleeding, contractions, leaking of fluid and fetal movement were reviewed in detail with the patient. Please refer to After Visit Summary for other counseling recommendations.   Return in about 1 week (around 06/01/2021).  No future appointments.   Sharen Counter, CNM

## 2021-05-28 ENCOUNTER — Ambulatory Visit (INDEPENDENT_AMBULATORY_CARE_PROVIDER_SITE_OTHER): Payer: Medicaid Other | Admitting: *Deleted

## 2021-05-28 ENCOUNTER — Encounter (HOSPITAL_COMMUNITY): Payer: Self-pay | Admitting: Obstetrics & Gynecology

## 2021-05-28 ENCOUNTER — Other Ambulatory Visit: Payer: Self-pay

## 2021-05-28 ENCOUNTER — Inpatient Hospital Stay (HOSPITAL_COMMUNITY)
Admission: AD | Admit: 2021-05-28 | Discharge: 2021-05-29 | DRG: 806 | Disposition: A | Discharge status: Home / Self Care | Payer: Medicaid Other | Attending: Obstetrics & Gynecology | Admitting: Obstetrics & Gynecology

## 2021-05-28 DIAGNOSIS — Z3A41 41 weeks gestation of pregnancy: Secondary | ICD-10-CM

## 2021-05-28 DIAGNOSIS — Z8759 Personal history of other complications of pregnancy, childbirth and the puerperium: Secondary | ICD-10-CM

## 2021-05-28 DIAGNOSIS — A6009 Herpesviral infection of other urogenital tract: Secondary | ICD-10-CM | POA: Diagnosis present

## 2021-05-28 DIAGNOSIS — O98319 Other infections with a predominantly sexual mode of transmission complicating pregnancy, unspecified trimester: Secondary | ICD-10-CM | POA: Diagnosis present

## 2021-05-28 DIAGNOSIS — O480 Post-term pregnancy: Secondary | ICD-10-CM | POA: Diagnosis present

## 2021-05-28 DIAGNOSIS — O43123 Velamentous insertion of umbilical cord, third trimester: Secondary | ICD-10-CM | POA: Diagnosis present

## 2021-05-28 DIAGNOSIS — O9832 Other infections with a predominantly sexual mode of transmission complicating childbirth: Secondary | ICD-10-CM | POA: Diagnosis present

## 2021-05-28 DIAGNOSIS — A6000 Herpesviral infection of urogenital system, unspecified: Secondary | ICD-10-CM | POA: Diagnosis present

## 2021-05-28 DIAGNOSIS — O43199 Other malformation of placenta, unspecified trimester: Secondary | ICD-10-CM | POA: Diagnosis present

## 2021-05-28 DIAGNOSIS — Z20822 Contact with and (suspected) exposure to covid-19: Secondary | ICD-10-CM | POA: Diagnosis present

## 2021-05-28 DIAGNOSIS — O4402 Placenta previa specified as without hemorrhage, second trimester: Secondary | ICD-10-CM

## 2021-05-28 DIAGNOSIS — Z3480 Encounter for supervision of other normal pregnancy, unspecified trimester: Secondary | ICD-10-CM

## 2021-05-28 DIAGNOSIS — Z3490 Encounter for supervision of normal pregnancy, unspecified, unspecified trimester: Secondary | ICD-10-CM

## 2021-05-28 LAB — COMPREHENSIVE METABOLIC PANEL
ALT: 14 U/L (ref 0–44)
AST: 22 U/L (ref 15–41)
Albumin: 3.4 g/dL — ABNORMAL LOW (ref 3.5–5.0)
Alkaline Phosphatase: 134 U/L — ABNORMAL HIGH (ref 38–126)
Anion gap: 12 (ref 5–15)
BUN: 12 mg/dL (ref 6–20)
CO2: 20 mmol/L — ABNORMAL LOW (ref 22–32)
Calcium: 9.5 mg/dL (ref 8.9–10.3)
Chloride: 103 mmol/L (ref 98–111)
Creatinine, Ser: 0.6 mg/dL (ref 0.44–1.00)
GFR, Estimated: >60 mL/min (ref >60)
Glucose, Bld: 76 mg/dL (ref 70–99)
Potassium: 3.8 mmol/L (ref 3.5–5.1)
Sodium: 135 mmol/L (ref 135–145)
Total Bilirubin: 0.5 mg/dL (ref 0.3–1.2)
Total Protein: 6.6 g/dL (ref 6.5–8.1)

## 2021-05-28 LAB — TYPE AND SCREEN
ABO/RH(D): O POS
Antibody Screen: NEGATIVE

## 2021-05-28 LAB — CBC
HCT: 39.3 % (ref 36.0–46.0)
Hemoglobin: 13.1 g/dL (ref 12.0–15.0)
MCH: 31.1 pg (ref 26.0–34.0)
MCHC: 33.3 g/dL (ref 30.0–36.0)
MCV: 93.3 fL (ref 80.0–100.0)
Platelets: 196 10*3/uL (ref 150–400)
RBC: 4.21 MIL/uL (ref 3.87–5.11)
RDW: 12.2 % (ref 11.5–15.5)
WBC: 7 10*3/uL (ref 4.0–10.5)
nRBC: 0 % (ref 0.0–0.2)

## 2021-05-28 LAB — RESP PANEL BY RT-PCR (FLU A&B, COVID) ARPGX2
Influenza A by PCR: NEGATIVE
Influenza B by PCR: NEGATIVE
SARS Coronavirus 2 by RT PCR: NEGATIVE

## 2021-05-28 MED ORDER — LACTATED RINGERS IV SOLN: 500.0000 mL | INTRAVENOUS | Status: DC | PRN

## 2021-05-28 MED ORDER — OXYCODONE-ACETAMINOPHEN 5-325 MG PO TABS: 2.0000 | ORAL_TABLET | ORAL | Status: DC | PRN

## 2021-05-28 MED ORDER — OXYTOCIN-SODIUM CHLORIDE 30-0.9 UT/500ML-% IV SOLN: 2.5000 [IU]/h | INTRAVENOUS | Status: DC

## 2021-05-28 MED ORDER — OXYCODONE-ACETAMINOPHEN 5-325 MG PO TABS: 1.0000 | ORAL_TABLET | ORAL | Status: DC | PRN

## 2021-05-28 MED ORDER — ONDANSETRON HCL 4 MG/2ML IJ SOLN: 4.0000 mg | Freq: Four times a day (QID) | INTRAMUSCULAR | Status: DC | PRN

## 2021-05-28 MED ORDER — OXYTOCIN BOLUS FROM INFUSION: 333.0000 mL | Freq: Once | INTRAVENOUS | Status: DC

## 2021-05-28 MED ORDER — SOD CITRATE-CITRIC ACID 500-334 MG/5ML PO SOLN: 30.0000 mL | ORAL | Status: DC | PRN

## 2021-05-28 MED ORDER — LIDOCAINE HCL (PF) 1 % IJ SOLN: 30.0000 mL | INTRAMUSCULAR | Status: DC | PRN

## 2021-05-28 MED ORDER — ACETAMINOPHEN 325 MG PO TABS: 650.0000 mg | ORAL_TABLET | ORAL | Status: DC | PRN

## 2021-05-28 MED ORDER — LACTATED RINGERS IV SOLN: INTRAVENOUS | Status: DC

## 2021-05-28 NOTE — Progress Notes (Signed)
Terri Manning is a 35 y.o. [id number] at [redacted]w[redacted]d admitted for induction of labor due to Non-reactive NST.  Subjective: Pt comfortable, no increase in contractions since membrane sweep at previous exam.  Pt has done some walking, resting, and nipple stimulation without change in contractions.   Objective: BP 120/68   Pulse 84   Resp 16   Ht 5\' 9"  (1.753 m)   Wt 79.8 kg   LMP 08/18/2020 (Approximate)   BMI 25.99 kg/m  No intake/output data recorded. No intake/output data recorded.  FHT:  FHR: 135 bpm, variability: moderate,  accelerations:  Present,  decelerations:  Absent UC:   rare SVE:   Dilation: 4 Effacement (%): 70 Station: -2 Exam by:: [email or web address] CNM Attempt to AROM pt unsuccessful  Labs: Lab Results  Component Value Date   WBC 7.0 05/28/2021   HGB 13.1 05/28/2021   HCT 39.3 05/28/2021   MCV 93.3 05/28/2021   PLT 196 05/28/2021    Assessment / Plan: Induction of labor due to non-reassuring fetal testing  Labor:  Unsuccessful attempt to AROM.  Discussed options with pt including having another provider attempt AROM, use of Cytotec, or try nipple stimulation again with some walking in room/hallway.  Pt desires to try walking and/or nipple stimulation at this time. Reviewed FHR tracing with DRs 14/01/2021 and Anyanwu who approve use of intermittent fetal auscultation and waterbirth Ok unless there are any changes.   Preeclampsia:   n/a Fetal Wellbeing:  Category I Pain Control:  Labor support without medications I/D:   GBS neg Anticipated MOD:  NSVD  Shawnie Pons 05/28/2021, 5:18 PM

## 2021-05-28 NOTE — Progress Notes (Signed)
Terri Manning is a 35 y.o. [id number] at [redacted]w[redacted]d admitted for induction of labor due to Non-reactive NST.  Subjective: Pt comfortable, tearful, worried about deceleration in the office. Did not desire induction but knows that there are concerns about baby and is open to plan to begin labor.  Objective: LMP 08/18/2020 (Approximate)  No intake/output data recorded. No intake/output data recorded.  FHT:  FHR: 135 bpm, variability: moderate,  accelerations:  Present,  decelerations:  Absent UC:   rare SVE:   Dilation: 4 Effacement (%): 70 Station: -2 Exam by:: Romelle Starcher CNM Swept pt membranes, pt tolerated well  Labs: Lab Results  Component Value Date   WBC 4.0 02/22/2020   HGB 12.3 02/22/2020   HCT 37.0 02/22/2020   MCV 96.4 02/22/2020   PLT 156 02/22/2020    Assessment / Plan: Induction of labor due to non-reassuring fetal testing,  progressing well on pitocin  Labor:  Discussed options, including starting Pitocin, using  Cytotec to begin cramping/contractions, and/or AROM.  FHR tracing Category I with accelerations since arrival at hospital.  Given normal FHR tracing, discussed option to sweep membranes and have pt walk/shower/etc and recheck in 3-4 hours. Pt agrees with plan to begin with sweep unless FHR dictates otherwise.   Preeclampsia:   n/a Fetal Wellbeing:  Category I Pain Control:  Labor support without medications I/D:   GBS neg Anticipated MOD:  NSVD  Sharen Counter 05/28/2021, 12:25 PM

## 2021-05-28 NOTE — H&P (Signed)
Terri Manning is a 35 y.o. female [phone]  at [redacted]w[redacted]d presenting for IOL for prolonged deceleration on NST in the office 05/28/21.  Pt desires waterbirth and has had low risk pregnancy. She preferred to wait for labor to begin in its own, and had reactive NST at [redacted]w[redacted]d in the office . She was scheduled for NST today, with plan for induction if no labor by [redacted]w[redacted]d.  Today on NST, there was a prolonged deceleration so she was sent to [facility] for induction of labor.      Nursing Staff Provider  Office Location [facility] Dating  U/S 12/29/20 EDD: 05/20/21  Language  English Anatomy US    Flu Vaccine  declined Genetic Screen  NIPS:  declined genetic screening    TDaP Vaccine   declined Hgb A1C or  GTT Early  Third trimester   COVID Vaccine none   LAB RESULTS   Rhogam  N/A Blood Type  O Pos  Feeding Plan breast Antibody  Negative  Contraception condoms Rubella  Immune  Circumcision no RPR  Negative  Pediatrician  [place], [place], [facility] HBsAg Negative (07/14 0000)   Support Person [facility]   Prenatal Classes  Waterbirth HIV  Non Reactive  BTL Consent NA GBS  Negative (For PCN allergy, check sensitivities)   VBAC Consent  Pap 2019, needs pp    Hgb Electro  Negative  BP Cuff  CF   PHQ-9/GAD-7  [X]  36 WEEKS SMA     Waterbirth  [ ]  Class [ ]  Consent [ ]  CNM visit    Induction  [ ]  Orders Entered [ ] Foley Y/N    OB History     Gravida  4   Para  2   Term  2   Preterm  0   AB  1   Living  2      SAB  1   IAB  0   Ectopic  0   Multiple  0   Live Births  2          No past medical history on file. Past Surgical History:  Procedure Laterality Date  . APPENDECTOMY     Family History: family history is not on file. Social History:  reports that she has never smoked. She has never used smokeless tobacco. She reports current alcohol use of about 2.0 standard drinks per week. No history on file for drug use.     Maternal Diabetes: No Genetic Screening:  Declined Maternal Ultrasounds/Referrals: Normal Fetal Ultrasounds or other Referrals:  None Maternal Substance Abuse:  No Significant Maternal Medications:  None Significant Maternal Lab Results:  None Other Comments:   GBS negative  Review of Systems  Constitutional:  Negative for chills, fatigue and fever.  Eyes:  Negative for visual disturbance.  Respiratory:  Negative for shortness of breath.   Cardiovascular:  Negative for chest pain.  Gastrointestinal:  Negative for abdominal pain and vomiting.  Genitourinary:  Negative for difficulty urinating, dysuria, flank pain, pelvic pain, vaginal bleeding, vaginal discharge and vaginal pain.  Neurological:  Negative for dizziness and headaches.  Psychiatric/Behavioral: Negative.    Maternal Medical History:  Reason for admission: Prolonged deceleration on NST   Dilation: 4 Effacement (%): 70 Station: -2 Exam by:: CNM Last menstrual period 08/18/2020.  Maternal Exam:  Uterine Assessment: Contraction strength is mild.  Contraction frequency is rare.  Abdomen: Fetal presentation: vertex Pelvis: adequate for delivery.   Cervix: Cervix evaluated by  digital exam.    Physical Exam Vitals and nursing note reviewed.  Constitutional:      Appearance: She is well-developed.  Cardiovascular:     Rate and Rhythm: Normal rate.  Pulmonary:     Effort: Pulmonary effort is normal.  Abdominal:     Palpations: Abdomen is soft.  Musculoskeletal:        General: Normal range of motion.     Cervical back: Normal range of motion.  Skin:    General: Skin is warm and dry.  Neurological:     Mental Status: She is alert and oriented to person, place, and time.  Psychiatric:        Behavior: Behavior normal.        Thought Content: Thought content normal.        Judgment: Judgment normal.    Prenatal labs: ABO, Rh:  O positive Antibody:  negative Rubella:  immune RPR:   nonreactive HBsAg: Negative (07/14 0000)  HIV:    negative GBS: Negative/-- (11/03 1049)   Assessment/Plan: [id number] at [redacted]w[redacted]d with nonreactive NST  GBS negative  IOL for nonreactive NST NST reactive on admission Continuous EFM on admission Discussed options for IOL and plan to sweep membranes now, consider AROM in 2-3 hours given Category I FHR tracing continues     [employer] 05/28/2021, 12:26 PM

## 2021-05-28 NOTE — Plan of Care (Signed)
  Problem: Education: Goal: Knowledge of Childbirth will improve Outcome: Progressing Goal: Ability to make informed decisions regarding treatment and plan of care will improve Outcome: Progressing Goal: Ability to state and carry out methods to decrease the pain will improve Outcome: Progressing Goal: Individualized Educational Video(s) Outcome: Progressing   Problem: Coping: Goal: Ability to verbalize concerns and feelings about labor and delivery will improve Outcome: Progressing   Problem: Life Cycle: Goal: Ability to make normal progression through stages of labor will improve Outcome: Progressing Goal: Ability to effectively push during vaginal delivery will improve Outcome: Progressing   Problem: Role Relationship: Goal: Will demonstrate positive interactions with the child Outcome: Progressing   Problem: Pain Management: Goal: Relief or control of pain from uterine contractions will improve Outcome: Progressing   Problem: Safety: Goal: Risk of complications during labor and delivery will decrease Outcome: Progressing

## 2021-05-28 NOTE — Progress Notes (Signed)
1035 NST started. 1039  Audible decel, EFM adjusted, decel confirmed. See fetal testing for documentation of FHR. Patient was tilted right, then turned to left side, then placed in hands and knees position during approx 7 min prolonged deceleration of FHR. MD assistance was called for and Dr. Alysia Penna responded. After continued assessment of FHR decision made by Dr. Alysia Penna to transport patient via EMS to labor and delivery for expedited delivery. Faculty attending in labor and delivery notified by Dr. Alysia Penna. EMS called by office manager. Patient was moved to right side lying and fetus tolerated position well. Upon EMS arrival, FHR had remained in the 115-120 bpm range with good variability and occ acceleration. EFM d/c'd. EMS attendants advised to keep patient in right tilt. Patient educated and offered reassurance and support throughout.

## 2021-05-28 NOTE — Progress Notes (Signed)
Patient ID: Terri Manning, female   DOB: Aug 23, 1985, 35 y.o.   MRN: [id number]  Labor Progress Note Terri Manning is a 35 y.o. [id number] at [redacted]w[redacted]d presented for  IOL due to postdates with non reassuring NST with subsequently very reassuring a reactive tracing.    S: feeling strong painful ctcx q 2-3 minutes. Coping well. Partner and doula at bedside.  O:  BP 120/80 (BP Location: Right Arm)   Pulse 85   Temp 97.6 F (36.4 C) (Oral)   Resp 17   Ht 5\' 9"  (1.753 m)   Wt 79.8 kg   LMP 08/18/2020 (Approximate)   BMI 25.99 kg/m  FHR  CVE: Dilation: 5 Effacement (%): 70 Cervical Position: Middle Station: -1 Presentation: Vertex Exam by:: [email or web address] MD   A&P: 35 y.o. 32 [redacted]w[redacted]d  #Labor: Progressing well. AROM at 17:02. Continue to encourage upright laboring and movement. Tub is partially filled.  #Pain: plans for WB #FWB: reassuring- on intermittent monitoring. Last check at 130.  #GBS negative  [redacted]w[redacted]d, MD 9:50 PM

## 2021-05-29 ENCOUNTER — Encounter (HOSPITAL_COMMUNITY): Payer: Self-pay | Admitting: Obstetrics & Gynecology

## 2021-05-29 DIAGNOSIS — Z3A41 41 weeks gestation of pregnancy: Secondary | ICD-10-CM

## 2021-05-29 DIAGNOSIS — O480 Post-term pregnancy: Secondary | ICD-10-CM

## 2021-05-29 LAB — RPR: RPR Ser Ql: NONREACTIVE

## 2021-05-29 MED ORDER — TETANUS-DIPHTH-ACELL PERTUSSIS 5-2.5-18.5 LF-MCG/0.5 IM SUSY: 0.5000 mL | PREFILLED_SYRINGE | Freq: Once | INTRAMUSCULAR | Status: DC

## 2021-05-29 MED ORDER — DOCUSATE SODIUM 100 MG PO CAPS: 100.0000 mg | ORAL_CAPSULE | Freq: Two times a day (BID) | ORAL | Status: DC

## 2021-05-29 MED ORDER — DIBUCAINE (PERIANAL) 1 % EX OINT: 1.0000 "application " | TOPICAL_OINTMENT | CUTANEOUS | Status: DC | PRN

## 2021-05-29 MED ORDER — ONDANSETRON HCL 4 MG PO TABS: 4.0000 mg | ORAL_TABLET | ORAL | Status: DC | PRN

## 2021-05-29 MED ORDER — IBUPROFEN 600 MG PO TABS
600.0000 mg | ORAL_TABLET | Freq: Four times a day (QID) | ORAL | Status: DC
  Administered 2021-05-29: 600 mg via ORAL
  Filled 2021-05-29 (×2): qty 1

## 2021-05-29 MED ORDER — METHYLERGONOVINE MALEATE 0.2 MG/ML IJ SOLN: 0.2000 mg | INTRAMUSCULAR | Status: DC | PRN

## 2021-05-29 MED ORDER — MEASLES, MUMPS & RUBELLA VAC IJ SOLR: 0.5000 mL | Freq: Once | INTRAMUSCULAR | Status: DC

## 2021-05-29 MED ORDER — COCONUT OIL OIL: 1.0000 "application " | TOPICAL_OIL | Status: DC | PRN

## 2021-05-29 MED ORDER — SIMETHICONE 80 MG PO CHEW: 80.0000 mg | CHEWABLE_TABLET | ORAL | Status: DC | PRN

## 2021-05-29 MED ORDER — BISACODYL 10 MG RE SUPP: 10.0000 mg | Freq: Every day | RECTAL | Status: DC | PRN

## 2021-05-29 MED ORDER — FERROUS SULFATE 325 (65 FE) MG PO TABS: 325.0000 mg | ORAL_TABLET | ORAL | 0 refills | Status: AC

## 2021-05-29 MED ORDER — IBUPROFEN 600 MG PO TABS: 600.0000 mg | ORAL_TABLET | Freq: Four times a day (QID) | ORAL | 0 refills | Status: DC | PRN

## 2021-05-29 MED ORDER — BENZOCAINE-MENTHOL 20-0.5 % EX AERO: 1.0000 "application " | INHALATION_SPRAY | CUTANEOUS | Status: DC | PRN

## 2021-05-29 MED ORDER — MEDROXYPROGESTERONE ACETATE 150 MG/ML IM SUSP: 150.0000 mg | INTRAMUSCULAR | Status: DC | PRN

## 2021-05-29 MED ORDER — METHYLERGONOVINE MALEATE 0.2 MG PO TABS: 0.2000 mg | ORAL_TABLET | ORAL | Status: DC | PRN

## 2021-05-29 MED ORDER — ONDANSETRON HCL 4 MG/2ML IJ SOLN: 4.0000 mg | INTRAMUSCULAR | Status: DC | PRN

## 2021-05-29 MED ORDER — FERROUS SULFATE 325 (65 FE) MG PO TABS
325.0000 mg | ORAL_TABLET | ORAL | Status: DC
  Filled 2021-05-29: qty 1

## 2021-05-29 MED ORDER — ACETAMINOPHEN 325 MG PO TABS: 650.0000 mg | ORAL_TABLET | ORAL | Status: DC | PRN

## 2021-05-29 MED ORDER — FLEET ENEMA 7-19 GM/118ML RE ENEM: 1.0000 | ENEMA | Freq: Every day | RECTAL | Status: DC | PRN

## 2021-05-29 MED ORDER — DIPHENHYDRAMINE HCL 25 MG PO CAPS: 25.0000 mg | ORAL_CAPSULE | Freq: Four times a day (QID) | ORAL | Status: DC | PRN

## 2021-05-29 MED ORDER — PRENATAL MULTIVITAMIN CH
1.0000 | ORAL_TABLET | Freq: Every day | ORAL | Status: DC
  Filled 2021-05-29: qty 1

## 2021-05-29 MED ORDER — WITCH HAZEL-GLYCERIN EX PADS: 1.0000 "application " | MEDICATED_PAD | CUTANEOUS | Status: DC | PRN

## 2021-05-29 NOTE — Discharge Instructions (Signed)
[facility]

## 2021-05-29 NOTE — Lactation Note (Signed)
This note was copied from a baby's chart. Lactation Consultation Note  Patient Name: Terri Manning [id number] Date: 05/29/2021 Reason for consult: Term Age:33 hours Per mom, her 2nd child had a lingual frenectomy and she breastfed him until 7 months, he is currently one year old. LC entered the room, mom recently latched infant on her left breast using the cradle hold position, infant latched with depth and was still breastfeeding when LC left the room. Mom knows to breastfeed infant according to primal cues, skin to skin. Mom knows to call RN/LC on MBU if she has any questions, concerns or needs assistance with latching infant at the breast. LC congratulated parents on the birth of their son.  Maternal Data    Feeding Mother's Current Feeding Choice: Breast Milk  LATCH Score Latch: Grasps breast easily, tongue down, lips flanged, rhythmical sucking.  Audible Swallowing: A few with stimulation  Type of Nipple: Everted at rest and after stimulation  Comfort (Breast/Nipple): Soft / non-tender  Hold (Positioning): No assistance needed to correctly position infant at breast.  LATCH Score: 9   Lactation Tools Discussed/Used    Interventions    Discharge    Consult Status Consult Status: Follow-up from L&D    Danelle Earthly 05/29/2021, 2:49 AM

## 2021-05-29 NOTE — Lactation Note (Signed)
This note was copied from a baby's chart. Lactation Consultation Note  Patient Name: Terri Manning [id number] Date: 05/29/2021 Reason for consult: Initial assessment;Term Age:33 hours   P3 mother whose infant is now 88 hours old.  This is a term baby at 41+2 weeks.  Mother breast fed her other two children (now 15 and 27 years old).  Mother's feeding preference is breast.  Lactation order entered at 1015 today.  Baby swaddled and asleep in mother's arms when I arrived.  Reviewed breast feeding basics with parents.  Mother mentioned that her son is very sleepy now but she has been continuing to attempt to feed.  Reassurance provided and encouraged STS and lots of hand expression.  Suggested she feed back any EBM she obtains to baby.    Offered to return for any questions or assistance as needed.  Father present.  RN updated.   Maternal Data Has patient been taught Hand Expression?: Yes Does the patient have breastfeeding experience prior to this delivery?: Yes How long did the patient breastfeed?: Last child for 7 months  Feeding Mother's Current Feeding Choice: Breast Milk  LATCH Score                    Lactation Tools Discussed/Used    Interventions Interventions: Breast feeding basics reviewed;Education  Discharge Pump: Personal  Consult Status Consult Status: Follow-up Date: 05/30/21 Follow-up type: In-patient    Lorynn Moeser R Zaide Mcclenahan 05/29/2021, 10:42 AM

## 2021-05-29 NOTE — Discharge Summary (Signed)
Postpartum Discharge Summary  Date of Service updated     Patient Name: Terri Manning DOB: December 23, 1985 MRN: [id number]  Date of admission: 05/28/2021 Delivery date:05/29/2021  Delivering provider: Caren Macadam  Date of discharge: 05/29/2021  Admitting diagnosis: Pregnancy [Z34.90] Intrauterine pregnancy: [redacted]w[redacted]d    Secondary diagnosis:  Principal Problem:   Pregnancy Active Problems:   History of delivery of macrosomal infant   Genital herpes affecting pregnancy, antepartum   Marginal insertion of umbilical cord affecting management of mother  Additional problems: Delivered water birth    Discharge diagnosis: Term Pregnancy Delivered                                              Post partum procedures:  None  Augmentation: AROM Complications: None  Hospital course: Induction of Labor With Vaginal Delivery   35y.o. yo [id number] admitted to the hospital 05/28/2021 for induction of labor.  Indication for induction:  post dates/HFR decel .  Patient had an uncomplicated labor course as follows: Membrane Rupture Time/Date: 5:02 PM ,05/28/2021   Delivery Method:Vaginal, Spontaneous  Episiotomy: None  Lacerations:  1st degree  Details of delivery can be found in separate delivery note.  Patient had a routine postpartum course. Patient is discharged home 05/29/21.  Newborn Data: Birth date:05/29/2021  Birth time:1:33 AM  Gender:Female  Living status:Living  Apgars:8 ,8  Weight:3175 g   Magnesium Sulfate received: No BMZ received: No Rhophylac:N/A MMR:N/A T-DaP: delcined Flu: No Transfusion:No  Physical exam  Vitals:   05/29/21 0317 05/29/21 0351 05/29/21 0500 05/29/21 1340  BP: (!) 101/49 101/70 118/70 109/64  Pulse: 78 98 66 76  Resp:  '16 16 18  ' Temp:  98.2 F (36.8 C) 98.3 F (36.8 C) 98.1 F (36.7 C)  TempSrc:  Oral Oral Oral  SpO2:   99%   Weight:      Height:       General: alert, cooperative, and no distress Lochia:  appropriate Uterine Fundus: firm Incision: N/A DVT Evaluation: No significant calf/ankle edema. Labs: Lab Results  Component Value Date   WBC 7.0 05/28/2021   HGB 13.1 05/28/2021   HCT 39.3 05/28/2021   MCV 93.3 05/28/2021   PLT 196 05/28/2021   CMP Latest Ref Rng & Units 05/28/2021  Glucose 70 - 99 mg/dL 76  BUN 6 - 20 mg/dL 12  Creatinine 0.44 - 1.00 mg/dL 0.60  Sodium 135 - 145 mmol/L 135  Potassium 3.5 - 5.1 mmol/L 3.8  Chloride 98 - 111 mmol/L 103  CO2 22 - 32 mmol/L 20(L)  Calcium 8.9 - 10.3 mg/dL 9.5  Total Protein 6.5 - 8.1 g/dL 6.6  Total Bilirubin 0.3 - 1.2 mg/dL 0.5  Alkaline Phos 38 - 126 U/L 134(H)  AST 15 - 41 U/L 22  ALT 0 - 44 U/L 14   Edinburgh Score: Edinburgh Postnatal Depression Scale Screening Tool 05/29/2021  I have been able to laugh and see the funny side of things. 0  I have looked forward with enjoyment to things. 0  I have blamed myself unnecessarily when things went wrong. 0  I have been anxious or worried for no good reason. 0  I have felt scared or panicky for no good reason. 0  Things have been getting on top of me. 0  I have been so unhappy that  I have had difficulty sleeping. 0  I have felt sad or miserable. 0  I have been so unhappy that I have been crying. 0  The thought of harming myself has occurred to me. 0  Edinburgh Postnatal Depression Scale Total 0     After visit meds:  Allergies as of 05/29/2021       Reactions   Ceclor [cefaclor] Nausea And Vomiting   hallucinations   Doxycycline Hives   Erythromycin Hives        Medication List     STOP taking these medications    valACYclovir 500 MG tablet Commonly known as: Valtrex       TAKE these medications    acetaminophen 325 MG tablet Commonly known as: Tylenol Take 2 tablets (650 mg total) by mouth every 4 (four) hours as needed (for pain scale < 4).   ferrous sulfate 325 (65 FE) MG tablet Take 1 tablet (325 mg total) by mouth every other day. Start taking  on: May 31, 2021   ibuprofen 600 MG tablet Commonly known as: ADVIL Take 1 tablet (600 mg total) by mouth every 6 (six) hours as needed.   PRENATAL PO Take 1 tablet by mouth daily.         Discharge home in stable condition Infant Feeding: Breast Infant Disposition:home with mother Discharge instruction: per After Visit Summary and Postpartum booklet. Activity: Advance as tolerated. Pelvic rest for 6 weeks.  Diet: routine diet Future Appointments: Future Appointments  Date Time Provider [place]  06/01/2021  1:30 PM Leftwich-Kirby, Kathie Dike, CNM [facility] None   Follow up Visit:  [facility] Follow up.   Contact information: [address] [place] [id number] [phone]                 Please schedule this patient for a Virtual postpartum visit in 4 weeks with the following provider: Any provider. Additional Postpartum F/U:  Low risk pregnancy complicated by:  Delivery mode:  Vaginal, Spontaneous  Anticipated Birth Control:  POPs   05/29/2021 Patriciaann Clan, DO

## 2021-05-30 ENCOUNTER — Inpatient Hospital Stay (HOSPITAL_COMMUNITY)
Admission: AD | Admit: 2021-05-30 | Discharge: 2021-05-31 | Disposition: A | Discharge status: Home / Self Care | Payer: Medicaid Other | Attending: Obstetrics & Gynecology | Admitting: Obstetrics & Gynecology

## 2021-05-30 ENCOUNTER — Encounter (HOSPITAL_COMMUNITY): Payer: Self-pay | Admitting: Obstetrics & Gynecology

## 2021-05-30 DIAGNOSIS — O9089 Other complications of the puerperium, not elsewhere classified: Secondary | ICD-10-CM | POA: Insufficient documentation

## 2021-05-30 DIAGNOSIS — R109 Unspecified abdominal pain: Secondary | ICD-10-CM | POA: Insufficient documentation

## 2021-05-30 DIAGNOSIS — N9489 Other specified conditions associated with female genital organs and menstrual cycle: Secondary | ICD-10-CM

## 2021-05-30 NOTE — MAU Note (Signed)
PP vag delivery/water birth. Pt stated she started having abd pain earlier today. Feels like labor pains. Reports bleeding is normal for after delivery.  Denies any fever or chills.

## 2021-05-31 ENCOUNTER — Inpatient Hospital Stay (HOSPITAL_COMMUNITY): Payer: Medicaid Other

## 2021-05-31 DIAGNOSIS — O9089 Other complications of the puerperium, not elsewhere classified: Secondary | ICD-10-CM | POA: Diagnosis not present

## 2021-05-31 DIAGNOSIS — R109 Unspecified abdominal pain: Secondary | ICD-10-CM | POA: Diagnosis not present

## 2021-05-31 LAB — URINALYSIS, ROUTINE W REFLEX MICROSCOPIC
Bilirubin Urine: NEGATIVE
Glucose, UA: NEGATIVE mg/dL
Ketones, ur: NEGATIVE mg/dL
Leukocytes,Ua: NEGATIVE
Nitrite: NEGATIVE
Protein, ur: NEGATIVE mg/dL
Specific Gravity, Urine: 1.012 (ref 1.005–1.030)
pH: 7 (ref 5.0–8.0)

## 2021-05-31 MED ORDER — IBUPROFEN 600 MG PO TABS: 600.0000 mg | ORAL_TABLET | Freq: Four times a day (QID) | ORAL | 0 refills | Status: DC

## 2021-05-31 MED ORDER — MORPHINE SULFATE (PF) 4 MG/ML IV SOLN
4.0000 mg | Freq: Once | INTRAVENOUS | Status: AC
  Administered 2021-05-31: 4 mg via INTRAMUSCULAR
  Filled 2021-05-31: qty 1

## 2021-05-31 MED ORDER — OXYCODONE-ACETAMINOPHEN 5-325 MG PO TABS: 1.0000 | ORAL_TABLET | Freq: Four times a day (QID) | ORAL | 0 refills | Status: AC | PRN

## 2021-05-31 NOTE — MAU Provider Note (Signed)
History     CSN: [id number]  Arrival date and time: 05/30/21 2250   Event Date/Time   First Provider Initiated Contact with Patient 05/31/21 0021      Chief Complaint  Patient presents with   Abdominal Pain   Terri Manning is a 35 y.o. [id number] at 2 Days Postpartum from a SVD via waterbirth tub who receives care at [facility].  She presents today for Abdominal Pain.  Patient describes the pain as "contractions" that has been occurring since yesterday.  She states the pain is intermittent in nature and occurs every 4 to 5 minutes currently but was initially irregular.  She states she took Tylenol at 2145 today and has been taking it after each use with no relief of her symptoms.  She reports her bleeding is moderate states this is consistent with previous deliveries.  She denies factors that improve her abdominal pain and identifies sitting as worsening it.  She endorses current nursing and reports infant does have some issues with latch, but is otherwise doing well.  Patient reports that she has only had about 7 hours of sleep since delivery and questions if this is contributing to her symptoms.   OB History     Gravida  4   Para  3   Term  3   Preterm  0   AB  1   Living  3      SAB  1   IAB  0   Ectopic  0   Multiple  0   Live Births  3           No past medical history on file.  Past Surgical History:  Procedure Laterality Date   APPENDECTOMY      No family history on file.  Social History   Tobacco Use   Smoking status: Never   Smokeless tobacco: Never  Substance Use Topics   Alcohol use: Yes    Alcohol/week: 2.0 standard drinks    Types: 2 Glasses of wine per week    Allergies:  Allergies  Allergen Reactions   Ceclor [Cefaclor] Nausea And Vomiting    hallucinations   Doxycycline Hives   Erythromycin Hives    Medications Prior to Admission  Medication Sig Dispense Refill Last Dose   acetaminophen (TYLENOL) 325 MG tablet Take 2  tablets (650 mg total) by mouth every 4 (four) hours as needed (for pain scale < 4).   05/30/2021   ferrous sulfate 325 (65 FE) MG tablet Take 1 tablet (325 mg total) by mouth every other day. 30 tablet 0 05/30/2021   ibuprofen (ADVIL) 600 MG tablet Take 1 tablet (600 mg total) by mouth every 6 (six) hours as needed. 30 tablet 0 05/30/2021   Prenatal Vit-Fe Fumarate-FA (PRENATAL PO) Take 1 tablet by mouth daily.   05/30/2021    Review of Systems  Constitutional:  Negative for chills and fever.  Gastrointestinal:  Positive for abdominal pain. Negative for constipation, diarrhea, nausea and vomiting.  Genitourinary:  Positive for vaginal bleeding. Negative for difficulty urinating, dysuria and pelvic pain.  Neurological:  Negative for dizziness, light-headedness and headaches.  Physical Exam   Blood pressure 109/70, pulse 78, unknown if currently breastfeeding.  Physical Exam Constitutional:      Appearance: She is well-developed.  HENT:     Head: Normocephalic and atraumatic.  Cardiovascular:     Rate and Rhythm: Normal rate and regular rhythm.     Heart sounds: Normal heart sounds.  Pulmonary:  Effort: Pulmonary effort is normal. No respiratory distress.     Breath sounds: Normal breath sounds.  Abdominal:     General: Bowel sounds are normal.     Palpations: Abdomen is soft.     Tenderness: There is generalized abdominal tenderness and tenderness in the right upper quadrant.     Comments: Fundus firm, U/-2, Nontender but patient discomfort noted when fundus touched during pain onset.  Genitourinary:    Uterus: Enlarged. Not tender.      Comments: Peripad assessed. Lochia appropriate. No malodor present.  Neurological:     Mental Status: She is alert.    MAU Course  Procedures Results for orders placed or performed during the hospital encounter of 05/30/21 (from the past 24 hour(s))  Urinalysis, Routine w reflex microscopic     Status: Abnormal   Collection Time: 05/31/21   1:30 AM  Result Value Ref Range   Color, Urine YELLOW YELLOW   APPearance HAZY (A) CLEAR   Specific Gravity, Urine 1.012 1.005 - 1.030   pH 7.0 5.0 - 8.0   Glucose, UA NEGATIVE NEGATIVE mg/dL   Hgb urine dipstick MODERATE (A) NEGATIVE   Bilirubin Urine NEGATIVE NEGATIVE   Ketones, ur NEGATIVE NEGATIVE mg/dL   Protein, ur NEGATIVE NEGATIVE mg/dL   Nitrite NEGATIVE NEGATIVE   Leukocytes,Ua NEGATIVE NEGATIVE   RBC / HPF 21-50 0 - 5 RBC/hpf   WBC, UA 0-5 0 - 5 WBC/hpf   Bacteria, UA RARE (A) NONE SEEN   Squamous Epithelial / LPF 0-5 0 - 5   Mucus PRESENT     US PELVIS LIMITED (TRANSABDOMINAL ONLY)  Result Date: 05/31/2021 CLINICAL DATA:  Initial evaluation for acute abdominal pain, recent vaginal delivery. EXAM: TRANSABDOMINAL ULTRASOUND OF PELVIS TECHNIQUE: Transabdominal ultrasound examination of the pelvis was performed including evaluation of the uterus, ovaries, adnexal regions, and pelvic cul-de-sac. COMPARISON:  None. FINDINGS: Uterus Measurements: 18.1 x 10.9 x 11.4 cm = volume: 1182.4 mL. Uterus is enlarged with somewhat globular contour. Heterogeneous echotexture throughout the uterine myometrium. Prominent vascularity noted within the uterine myometrium as well. Findings consistent with recent pregnancy. No discrete fibroid or other mass. Endometrium Thickness: 16.1 mm. Scattered areas of vascularity seen within the endometrial complex. No other focal abnormality. Trace fluid noted within the endometrial cavity. Right ovary Not visualized.  No adnexal mass. Left ovary Not visualized.  No adnexal mass. Other findings: Small volume free fluid noted within the right adnexa. IMPRESSION: 1. Enlarged postpartum uterus. Endometrial stripe measures 16 mm in thickness with scattered areas of internal vascularity. Clinical correlation for possible retained products of conception recommended. 2. Nonvisualization of either ovary.  No adnexal mass. 3. No other acute abnormality within the pelvis.  Electronically Signed   By: Rise Mu M.D.   On: 05/31/2021 02:02    MDM Physical Exam Pain Medication Ultrasound Consult Prescription Assessment and Plan  35 year old, [id number]  Postpartum State Uterine Pain  -Reviewed POC with patient. -Exam performed and findings discussed.  -Discussed possible involution worsened by inadequate pain management. -Patient offered and accepts pain medication.  Morphine 4 mg IM ordered. -We will send for pelvic ultrasound to rule out retained products.  Cherre Robins 05/31/2021, 12:21 AM   Reassessment (2:09 AM)  -Results as above. -Imaging reviewed by provider. -Dr. Otelia Limes consulted and informed of patient status, evaluation, interventions, and results. Advised: *Give prescription for pain medication. *Okay to discharge home. -Provider to bedside to discuss results. -Patient reports improvement in pain with medication  dosing. -Discussed pain medication usage at home. -Rx for Percocet 5/325 dispense 6 refill 0 sent to pharmacy on file. -Patient with previous prescription for ibuprofen instructed to take as needed. Will send updated script to 24 hour pharmacy. -Precautions given.  -Encouraged rest. -Encouraged to call primary office or return to MAU if symptoms worsen or with the onset of new symptoms, especially fever. -Discharged to home in stable condition.  Cherre Robins MSN, CNM Advanced Practice Provider, [facility] [employer]

## 2021-06-01 ENCOUNTER — Encounter: Payer: Medicaid Other | Admitting: Advanced Practice Midwife

## 2021-06-03 ENCOUNTER — Telehealth (HOSPITAL_COMMUNITY): Payer: Self-pay | Admitting: Lactation Services

## 2021-06-11 ENCOUNTER — Telehealth (HOSPITAL_COMMUNITY): Payer: Self-pay

## 2021-06-11 NOTE — Telephone Encounter (Signed)
No answer. Left message to return nurse call.  Marcelino Duster [facility] 06/11/2021,1712

## 2021-06-24 ENCOUNTER — Telehealth: Payer: Self-pay | Admitting: Family Medicine

## 2021-06-24 NOTE — Telephone Encounter (Signed)
Called patient in reference to the lactation inbasket message, there was no answer to the call so a voicemail was left with a call back number for the office.

## 2021-06-30 ENCOUNTER — Ambulatory Visit: Payer: Self-pay

## 2021-06-30 NOTE — Lactation Note (Signed)
This note was copied from a baby's chart.  Infant is 39 weeks old mother, Narelle Schoening called asking to schedule an outpatient appt. LC left a voicemail with outpatient LC services contact information.

## 2021-07-07 ENCOUNTER — Telehealth: Payer: Self-pay

## 2021-07-07 ENCOUNTER — Telehealth: Payer: Medicaid Other | Admitting: Advanced Practice Midwife

## 2021-07-07 NOTE — Telephone Encounter (Signed)
We are trying to reach you to start your visit, please call our Office on [phone]

## 2021-08-04 ENCOUNTER — Telehealth: Payer: Self-pay | Admitting: Advanced Practice Midwife

## 2021-08-04 ENCOUNTER — Encounter: Payer: Self-pay | Admitting: Advanced Practice Midwife

## 2021-08-04 ENCOUNTER — Telehealth (INDEPENDENT_AMBULATORY_CARE_PROVIDER_SITE_OTHER): Payer: Medicaid Other | Admitting: Advanced Practice Midwife

## 2021-08-04 VITALS — Ht 69.0 in | Wt 145.0 lb

## 2021-08-04 DIAGNOSIS — R102 Pelvic and perineal pain: Secondary | ICD-10-CM

## 2021-08-04 DIAGNOSIS — M6289 Other specified disorders of muscle: Secondary | ICD-10-CM | POA: Diagnosis not present

## 2021-08-04 NOTE — Progress Notes (Signed)
VIRTUAL VISIT ENCOUNTER NOTE  Provider location: [facility]   Patient location: Home  I connected with Rutherford Guys on 08/04/21 at  2:50 PM EST by MyChart Video Encounter and verified that I am speaking with the correct person using two identifiers.   I discussed the limitations, risks, security and privacy concerns of performing an evaluation and management service virtually and the availability of in person appointments. I also discussed with the patient that there may be a patient responsible charge related to this service. The patient expressed understanding and agreed to proceed.    The following portions of the patient's history were reviewed and updated as appropriate: allergies, current medications, past family history, past medical history, past social history, past surgical history and problem list.      Post Partum Visit Note  Terri Manning is a 36 y.o. [phone] female who presents for a postpartum visit. She is 10 weeks postpartum following a normal spontaneous vaginal delivery.  I have fully reviewed the prenatal and intrapartum course. The delivery was at 41.2 gestational weeks.  Anesthesia: none. Postpartum course has been unremarkable. Baby is doing well. Baby is feeding by breast. Bleeding no bleeding. Bowel function is normal. Bladder function is normal. Patient is sexually active. Contraception method is condoms. Postpartum depression screening: negative.   Upstream - 08/04/21 1412       Pregnancy Intention Screening   Does the patient want to become pregnant in the next year? No    Does the patient's partner want to become pregnant in the next year? No    Would the patient like to discuss contraceptive options today? No      Contraception Wrap Up   Current Method Female Condom    End Method Female Condom            The pregnancy intention screening data noted above was reviewed. Potential methods of contraception were  discussed. The patient elected to proceed with Female Condom.   Edinburgh Postnatal Depression Scale - 08/04/21 1412       Edinburgh Postnatal Depression Scale:  In the Past 7 Days   I have been able to laugh and see the funny side of things. 0    I have looked forward with enjoyment to things. 0    I have blamed myself unnecessarily when things went wrong. 0    I have been anxious or worried for no good reason. 0    I have felt scared or panicky for no good reason. 0    Things have been getting on top of me. 0    I have been so unhappy that I have had difficulty sleeping. 0    I have felt sad or miserable. 0    I have been so unhappy that I have been crying. 0    The thought of harming myself has occurred to me. 0    Edinburgh Postnatal Depression Scale Total 0             Health Maintenance Due  Topic Date Due   COVID-19 Vaccine (1) Never done   PAP SMEAR-Modifier  Never done   INFLUENZA VACCINE  Never done   Health Maintenance:  Normal pap in 3019.     The following portions of the patient's history were reviewed and updated as appropriate: allergies, current medications, past family history, past medical history, past social history, past surgical history, and problem list.  Review of  Systems Pertinent items noted in HPI and remainder of comprehensive ROS otherwise negative.  Objective:  Ht 5\' 9"  (1.753 m)    Wt 145 lb (65.8 kg)    Breastfeeding Yes    BMI 21.41 kg/m    General:  Alert, oriented and cooperative. Patient appears to be in no acute distress.  Mental Status: Normal mood and affect. Normal behavior. Normal judgment and thought content.   Respiratory: Normal respiratory effort, no problems with respiration noted  Rest of physical exam deferred due to type of encounter  Assessment:   1. Pelvic floor dysfunction in female --Pt with pelvic pain, similar to during previous pregnancies, but not resolved now 10 weeks PP.   --Continue NSAIDs, pt doing at home  exercises for diastasis recti, which will likely help. - Ambulatory referral to Physical Therapy  2. Female pelvic pain  - Ambulatory referral to Physical Therapy   Plan:   Essential components of care per ACOG recommendations:  1.  Mood and well being: Patient with negative depression screening today. Reviewed local resources for support.  - Patient tobacco use? No.   - hx of drug use? No.    2. Infant care and feeding:  -Patient currently breastmilk feeding? Yes. Reviewed importance of draining breast regularly to support lactation.  -Social determinants of health (SDOH) reviewed in EPIC. No concerns      3. Sexuality, contraception and birth spacing - Patient does not want a pregnancy in the next year.  --Discussed pt contraceptive plans and reviewed contraceptive methods based on pt preferences and effectiveness.  Pt prefers condoms and NFP/lactational amenorrhea at this time.  4. Sleep and fatigue -Encouraged family/partner/community support of 4 hrs of uninterrupted sleep to help with mood and fatigue  5. Physical Recovery  - Discussed patients delivery and complications. She describes her labor as good. - Patient had a waterbirth, Vaginal, no problems at delivery. Patient had a 1st degree laceration. Perineal healing reviewed. Patient expressed understanding - Patient has urinary incontinence? No. - Patient is safe to resume physical and sexual activity  6.  Health Maintenance - HM due items addressed Yes - Last pap smear No results found for: DIAGPAP Pap smear not done at today's visit.  -Breast Cancer screening indicated? No.   7. Chronic Disease/Pregnancy Condition follow up: None  - PCP follow up   I discussed the assessment and treatment plan with the patient. The patient was provided an opportunity to ask questions and all were answered. The patient agreed with the plan and demonstrated an understanding of the instructions.   The patient was advised to call  back or seek an in-person evaluation/go to the ED if the symptoms worsen or if the condition fails to improve as anticipated.  I provided 10 minutes of face-to-face time during this encounter.  , CNM [facility], [facility]

## 2021-08-04 NOTE — Telephone Encounter (Signed)
Pt last recorded pap was in 2019.  PP visit was virtual on 08/04/21, so Pap not performed.  If no other Pap at another location, pt needs annual/Pap scheduled this year.  Left message for pt to return call.

## 2021-08-06 ENCOUNTER — Ambulatory Visit: Payer: Medicaid Other | Admitting: Physical Therapy

## 2021-08-12 ENCOUNTER — Ambulatory Visit: Payer: Medicaid Other | Attending: Advanced Practice Midwife | Admitting: Physical Therapy

## 2021-08-12 ENCOUNTER — Encounter: Payer: Self-pay | Admitting: Physical Therapy

## 2021-08-12 DIAGNOSIS — M6281 Muscle weakness (generalized): Secondary | ICD-10-CM | POA: Insufficient documentation

## 2021-08-12 DIAGNOSIS — R102 Pelvic and perineal pain: Secondary | ICD-10-CM | POA: Diagnosis not present

## 2021-08-12 DIAGNOSIS — R293 Abnormal posture: Secondary | ICD-10-CM | POA: Diagnosis present

## 2021-08-12 DIAGNOSIS — R269 Unspecified abnormalities of gait and mobility: Secondary | ICD-10-CM | POA: Insufficient documentation

## 2021-08-12 DIAGNOSIS — M62838 Other muscle spasm: Secondary | ICD-10-CM | POA: Insufficient documentation

## 2021-08-12 DIAGNOSIS — R279 Unspecified lack of coordination: Secondary | ICD-10-CM | POA: Diagnosis present

## 2021-08-12 DIAGNOSIS — M6289 Other specified disorders of muscle: Secondary | ICD-10-CM | POA: Diagnosis not present

## 2021-08-12 NOTE — Therapy (Signed)
OUTPATIENT PHYSICAL THERAPY FEMALE PELVIC EVALUATION   Patient Name: Terri Manning MRN: [id number] DOB:1985/10/13, 36 y.o., female Today's Date: 08/12/2021   PT End of Session - 08/12/21 1529     Visit Number 1    Date for PT Re-Evaluation 11/09/21    Authorization Type healthy blue    PT Start Time 1525    PT Stop Time 1610    PT Time Calculation (min) 45 min    Activity Tolerance Patient tolerated treatment well    Behavior During Therapy [facility] for tasks assessed/performed             History reviewed. No pertinent past medical history. Past Surgical History:  Procedure Laterality Date   APPENDECTOMY     Patient Active Problem List   Diagnosis Date Noted   History of delivery of macrosomal infant 04/07/2021   Genital herpes affecting pregnancy, antepartum 04/07/2021    PCP: Lenord Fellers, PA-C  REFERRING PROVIDER: Hurshel Party,*  REFERRING DIAG: Hurshel Party, CNM  THERAPY DIAG:  Muscle weakness (generalized)  Abnormal posture  Unspecified lack of coordination  Other muscle spasm  Abnormality of gait and mobility  ONSET DATE: With second pregnancy in second trimester, got better postpartum than worsened with third pregnancy.   SUBJECTIVE:                                                                                                                                                                                           SUBJECTIVE STATEMENT: Pt reports she has had pelvic pain with second pregnancy with second trimester and had PT for PSD and pain which helped and then had no pain postpartum and found out she was pregnant with third child when 4 months postpartum with baby two. Pt reports  she started to have pelvic pain again in second trimester and has no gone away.  Fluid intake: Yes: drinks a lot of water and is still breastfeeding     Patient confirms identification and approves PT to assess pelvic floor and treatment  Yes  PERTINENT HISTORY:  Three vaginal births Sexual abuse: No  PAIN:  Are you having pain? Yes NPRS scale: 7/10 with sudden and sharp and then lowers Pain location: Internal and Vaginal toward deep pelvis  Pain type: sharp Pain description: sharp   Aggravating factors: squatting, sitting on hard surface for longer than 30 mins, intercourse one time was painful post baby 3 with pain and some bleeding but went away but has not been other than this.  Relieving factors: coming into standing from squatting or rest   BOWEL MOVEMENT Pain with bowel movement: No  Type of bowel movement:Type (Bristol Stool Scale) 4 Fully empty rectum: Yes:   Leakage: No Pads: No Fiber supplement: No  URINATION Pain with urination: No Fully empty bladder: Yes:   Stream: Strong Urgency: Yes: sometimes but not always Frequency: usually 2-3 hours, will go to urinate at night however wakes to feed baby Leakage: Sneezing Pads: No  INTERCOURSE Pain with intercourse: During Penetration, After Intercourse, however this was only painful one time  Ability to have vaginal penetration:  Yes: no dryness Types of stimulation: no pain Climax: Yes: no pain Marinoff Scale  0  PREGNANCY Vaginal deliveries 3 Tearing Yes: first delivery had 2 stitches but none with next   C-section deliveries 0 Currently pregnant No  PROLAPSE None  PRECAUTIONS: Other: postpartum 12/22  WEIGHT BEARING RESTRICTIONS No  FALLS:  Has patient fallen in last 6 months? No, Number of falls: 0  LIVING ENVIRONMENT: Lives with: lives with their family Lives in: House/apartment  Has following equipment at home: None  OCCUPATION: works as an Airline pilot   PLOF: Independent  PATIENT GOALS to have no pain   OBJECTIVE:   DIAGNOSTIC FINDINGS:  None at this time    COGNITION:  Overall cognitive status: Within functional limits for tasks assessed     SENSATION:  Light touch: Appears intact  Proprioception: Appears  intact  MUSCLE LENGTH: Bil hamstrings, adductors and hip IR limited 25%  POSTURE:  Rounded shoulders, posterior pelvic tilt  PALPATION: Internal Pelvic Floor no TTP  External Perineal Exam WFL  GENERAL no TTP, WFL, no skin irritation  LUMBARAROM/PROM  A/PROM A/PROM  08/12/2021  Flexion WFL  Extension WFL  Right lateral flexion Limited by 25%  Left lateral flexion Limited by 25%  Right rotation WFL  Left rotation WFL   (Blank rows = not tested)  LE AROM/PROM: all WFL    LE MMT:  MMT Right 08/12/2021 Left 08/12/2021  Hip flexion 4+/5 with pain at Rt SIJ 4/5  Hip extension 3+/5 3/5  Hip abduction 3+/5 3+/5  Hip adduction 3+/5 3+/5  Hip internal rotation 4/5 4/5  Hip external rotation 4/5 4/5  Knee flexion 5/5 5/5  Knee extension 5/5 5/5  Ankle dorsiflexion 5/5 5/5  Ankle plantarflexion 5/5 5/5  Ankle inversion 5/5 5/5  Ankle eversion 5/5 5/5   PELVIC MMT:   MMT  08/12/2021  Vaginal 4/5  Internal Anal Sphincter   External Anal Sphincter   Puborectalis   Diastasis Recti 1.5 finger width above umbilicus and 1 below, poor resistance noted in separation however  (Blank rows = not tested)   TONE: WFL  PROLAPSE: none  LUMBAR SPECIAL TESTS:  Straight leg raise test: Negative, Single leg stance test: Negative, Stork standing: Positive, and SI Compression/distraction test: Positive All positives with pain at Rt SIJ and pain increased with standing on Lt LE with Rt SIJ pain for stork test  FUNCTIONAL TESTS:  Squat: fair technique however pain with 50% decent and unable to continue      TODAY'S TREATMENT  EVAL HEP went over and education for reconnection with core, breathing, and pelvic floor for improved carry over with HEP   PATIENT EDUCATION:  Education details: HEP given and proper technique for pelvic floor contraction/relaxation educated on Person educated: Patient Education method: Programmer, multimedia, Demonstration, Tactile cues, Verbal cues, and  Handouts Education comprehension: verbalized understanding and returned demonstration   HOME EXERCISE PROGRAM: [id number]  ASSESSMENT:  CLINICAL IMPRESSION: Patient is a 36 y.o. female who was seen today for physical therapy evaluation  and treatment for postpartum pelvic floor dysfunction. Pt has h/o 3 vaginal deliveries with first having tearing with 2 stitches but none with next three. First baby born in 2014, next in 10/21 and third in 12/22. Pt became pregnant with third baby 4 months postpartum with second baby. Pt reports she had pelvic floor PT during second pregnancy second trimester with PSD and pain which did help and pain resolved after birth of baby however pain returned and was worse with third baby in second trimester. Pt pain is sharp and sudden with certain movements and does resolve quickly but worse with squatting, itting on hard surface for longer than 30 mins, intercourse one time was painful post baby 3 with pain and some bleeding but went away but has not been other than this and relieved with rest and returning to standing. Pt reports she does notice some weakness in her core, intermittent LBP, and concerned about DRA. Pt found to have decreased spinal mobility, decreased hip mobility and strength, and does have separation of DRA above and below umbilicus 1.5-1 finger width with decreased density. Pt consented to internal pelvic floor assessment vaginally and found to have decreased strength, endurance, and coordination. Pt tolerated well with out pain and would benefit from additional PT to further address deficits.    OBJECTIVE IMPAIRMENTS decreased coordination, decreased endurance, decreased mobility, decreased strength, increased fascial restrictions, increased muscle spasms, improper body mechanics, postural dysfunction, and pain.   ACTIVITY LIMITATIONS community activity, yard work, and intercourse, carrying for children .   PERSONAL FACTORS Fitness, Time since onset of  injury/illness/exacerbation, and 3+ comorbidities: x3 vaginal births and x2 within 14 months  are also affecting patient's functional outcome.    REHAB POTENTIAL: Good  CLINICAL DECISION MAKING: Stable/uncomplicated  EVALUATION COMPLEXITY: Low   GOALS: Goals reviewed with patient? Yes  SHORT TERM GOALS:  STG Name Target Date Goal status  1 Pt to be I with HEP.  Baseline: given at eval 09/09/2021 INITIAL  2 Pt to demonstrate at least 4/5 pelvic floor strength and ability to hold contraction for at least 5s for improved pelvic stability.  Baseline: 4/5 for one second 09/09/2021 INITIAL  3 Baseline:    4  Baseline:    5  Baseline:    6  Baseline:    7  Baseline:     LONG TERM GOALS:   LTG Name Target Date Goal status  1 Pt to be I with advanced HEP.  Baseline: 11/09/2021 INITIAL  2 Pt to demonstrate at least 5/5 pelvic floor strength and ability to hold contraction for at least 8s for improved pelvic stability.  Baseline: 4/5 for 1 s 11/09/2021  INITIAL  3 Pt to demonstrate at least 5/5 bil hip strength for improved pelvic stability and functional squats for carrying for children without pain.  Baseline: 3+/5 - 4/5 grossly in bil hips 11/09/2021 INITIAL  4 Pt to report no more than 2/10 pain with functional mobility such as squats for improved QOL and ability to lift children Baseline:7/10 11/09/2021 INITIAL  5 Pt to demonstrate good technique with lifting at least 25# from ground for improved mobility and decreased pain.  Baseline: fair technique with body weight only and limited in full squat due to pain.    INITIAL  6  Baseline:    7  Baseline:     PLAN: PT FREQUENCY: 1x/week  PT DURATION:  10 sessions  PLANNED INTERVENTIONS: Therapeutic exercises, Therapeutic activity, Neuro Muscular re-education, Balance training, Gait training, Patient/Family  education, Joint mobilization, Cryotherapy, Moist heat, scar mobilization, and Taping  PLAN FOR NEXT SESSION: go over  HEP, core and hip strengthening  No emotional/communication barriers or cognitive limitation. Patient is motivated to learn. Patient understands and agrees with treatment goals and plan. PT explains patient will be examined in standing, sitting, and lying down to see how their muscles and joints work. When they are ready, they will be asked to remove their underwear so PT can examine their perineum. The patient is also given the option of providing their own chaperone as one is not provided in our facility. The patient also has the right and is explained the right to defer or refuse any part of the evaluation or treatment including the internal exam. With the patient's consent, PT will use one gloved finger to gently assess the muscles of the pelvic floor, seeing how well it contracts and relaxes and if there is muscle symmetry. After, the patient will get dressed and PT and patient will discuss exam findings and plan of care. PT and patient discuss plan of care, schedule, attendance policy and HEP activities.   Otelia SergeantHaley Kore Madlock, PT, DPT 02/22/234:35 PM

## 2021-08-27 ENCOUNTER — Encounter: Payer: Medicaid Other | Admitting: Physical Therapy

## 2021-09-01 ENCOUNTER — Ambulatory Visit: Payer: Medicaid Other | Admitting: Physical Therapy

## 2021-09-08 ENCOUNTER — Telehealth: Payer: Self-pay | Admitting: Physical Therapy

## 2021-09-08 ENCOUNTER — Ambulatory Visit: Payer: Medicaid Other | Attending: Advanced Practice Midwife | Admitting: Physical Therapy

## 2021-09-08 DIAGNOSIS — R293 Abnormal posture: Secondary | ICD-10-CM | POA: Insufficient documentation

## 2021-09-08 DIAGNOSIS — R269 Unspecified abnormalities of gait and mobility: Secondary | ICD-10-CM | POA: Insufficient documentation

## 2021-09-08 DIAGNOSIS — M62838 Other muscle spasm: Secondary | ICD-10-CM | POA: Insufficient documentation

## 2021-09-08 DIAGNOSIS — R102 Pelvic and perineal pain: Secondary | ICD-10-CM | POA: Insufficient documentation

## 2021-09-08 DIAGNOSIS — M6289 Other specified disorders of muscle: Secondary | ICD-10-CM | POA: Insufficient documentation

## 2021-09-08 DIAGNOSIS — M6281 Muscle weakness (generalized): Secondary | ICD-10-CM | POA: Insufficient documentation

## 2021-09-08 DIAGNOSIS — R279 Unspecified lack of coordination: Secondary | ICD-10-CM | POA: Insufficient documentation

## 2021-09-08 NOTE — Telephone Encounter (Signed)
PT called pt about today's missed appointment at 1615. Pt did not answer, voicemail left.  ? ?Otelia Sergeant, PT, DPT ?03/21/234:46 PM ? ? ?

## 2021-09-17 ENCOUNTER — Encounter: Payer: Medicaid Other | Admitting: Physical Therapy

## 2021-09-22 ENCOUNTER — Encounter: Payer: Medicaid Other | Admitting: Physical Therapy

## 2021-09-22 ENCOUNTER — Telehealth: Payer: Self-pay | Admitting: Physical Therapy

## 2021-09-22 NOTE — Telephone Encounter (Signed)
PT called pt about today's appointment at 1615. Pt did not answer, voicemail left.  ?Unfortunately, due to our clinic's attendance policy patient will be discharged from PT and will need a new referral for future PT needs.  ? ?Thank you, ?Otelia Sergeant, PT, DPT ?04/04/234:46 PM   ?

## 2021-09-29 ENCOUNTER — Ambulatory Visit: Payer: Medicaid Other | Admitting: Physical Therapy

## 2021-10-06 ENCOUNTER — Encounter: Payer: Medicaid Other | Admitting: Physical Therapy

## 2021-10-13 ENCOUNTER — Encounter: Payer: Medicaid Other | Admitting: Physical Therapy

## 2021-10-20 ENCOUNTER — Encounter: Payer: Medicaid Other | Admitting: Physical Therapy

## 2021-10-27 ENCOUNTER — Encounter: Payer: Medicaid Other | Admitting: Physical Therapy

## 2022-05-26 ENCOUNTER — Encounter: Payer: Self-pay | Admitting: *Deleted

## 2022-06-07 ENCOUNTER — Ambulatory Visit (INDEPENDENT_AMBULATORY_CARE_PROVIDER_SITE_OTHER): Payer: Medicaid Other

## 2022-06-07 DIAGNOSIS — Z3200 Encounter for pregnancy test, result unknown: Secondary | ICD-10-CM

## 2022-06-07 LAB — POCT URINE PREGNANCY: Preg Test, Ur: POSITIVE — AB

## 2022-06-07 NOTE — Progress Notes (Signed)
..  Terri Manning presents today for UPT. She has no unusual complaints. LMP:04/24/22    OBJECTIVE: Appears well, in no apparent distress.  OB History     Gravida  5   Para  3   Term  3   Preterm  0   AB  1   Living  3      SAB  1   IAB  0   Ectopic  0   Multiple  0   Live Births  3          Home UPT Result: Positive In-Office UPT result: Positive I have reviewed the patient's medical, obstetrical, social, and family histories, and medications.   ASSESSMENT: Positive pregnancy test  PLAN Prenatal care to be completed at: [facility]

## 2022-06-21 NOTE — L&D Delivery Note (Signed)
Delivery Note: Terri Manning [id number] at [redacted]w[redacted]d  Admitting diagnosis: Indication for care in labor or delivery [O75.9]   First Stage: Induction of labor: No Onset of labor: 01/29/23 0215 AM Analgesia [name] control intrapartum: None  Second Stage: Consented for WB and entered tub. WB provider called to room when patient reported rectal pressure.   Complete dilation at 01/29/2023 10:10 AM Pushing with contractions per patient desire FHR monitored intermittently, appropriate throughout pushing.  Pushing in variable positions with waterbirth provider and LD support staff present for birth and supportive. Nuchal Cord: No  Delivery of a Live born female  Birth Weight:   APGAR: 9, 9  Newborn Delivery   Birth date/time: 01/29/2023 10:59:00 Delivery type: Vaginal, Spontaneous    Infant delivered in cephalic presentation, in OA position into the tub. Grasped by mother and WB physician.   Cord double clamped after cessation of pulsation by MD, cut by [name].  Collection of cord blood for typing completed. Cord blood donation-None Arterial cord blood sample-No   Third Stage: Patient stood out of the water and with gentle traction placenta delivered-Spontaneous with 3 vessels. Uterine tone Firm  bleeding - minimal Uterotonics: None- Patient declined active management of 3rd stage of labor.  Placenta to birth parent, placed to family provided cooler.  None laceration identified.  Episiotomy:None Local analgesia: None  Repair:  None Est. Blood Loss (mL):155.00  Complications: None   Mom to postpartum.  [name] to Couplet care / Skin to Skin.  Delivery Report:   Review the Delivery Report for details.     Signed: Federico Flake, MD MPH 01/29/2023, 11:59 AM

## 2022-07-01 ENCOUNTER — Ambulatory Visit: Payer: Medicaid Other | Admitting: *Deleted

## 2022-07-01 ENCOUNTER — Ambulatory Visit (INDEPENDENT_AMBULATORY_CARE_PROVIDER_SITE_OTHER): Payer: Medicaid Other

## 2022-07-01 DIAGNOSIS — O3680X0 Pregnancy with inconclusive fetal viability, not applicable or unspecified: Secondary | ICD-10-CM

## 2022-07-01 DIAGNOSIS — Z3481 Encounter for supervision of other normal pregnancy, first trimester: Secondary | ICD-10-CM

## 2022-07-01 DIAGNOSIS — Z3A09 9 weeks gestation of pregnancy: Secondary | ICD-10-CM

## 2022-07-01 DIAGNOSIS — Z3480 Encounter for supervision of other normal pregnancy, unspecified trimester: Secondary | ICD-10-CM

## 2022-07-01 MED ORDER — BLOOD PRESSURE KIT DEVI: 1.0000 | 0 refills | Status: DC

## 2022-07-01 NOTE — Progress Notes (Signed)
New OB Intake  I connected withNAME@  on 07/01/22 at  8:15 AM EST by In Person Visit and verified that I am speaking with the correct person using two identifiers. Nurse is located at [facility] and pt is located at [place].  I discussed the limitations, risks, security and privacy concerns of performing an evaluation and management service by telephone and the availability of in person appointments. I also discussed with the patient that there may be a patient responsible charge related to this service. The patient expressed understanding and agreed to proceed.  I explained I am completing New OB Intake today. We discussed EDD of 01/29/23 that is based on LMP of 04/24/22. Pt is G5/P3. I reviewed her allergies, medications, Medical/Surgical/OB history, and appropriate screenings. I informed her of [facility] services. [facility] information placed in AVS. Based on history, this is a low risk pregnancy.  Patient Active Problem List   Diagnosis Date Noted   History of delivery of macrosomal infant 04/07/2021   Genital herpes affecting pregnancy, antepartum 04/07/2021    Concerns addressed today  Delivery Plans Plans to deliver at [facility]. Patient given information for [facility] Healthy Baby website for more information about Women's and [place]. Patient is interested in water birth. Offered upcoming OB visit with CNM to discuss further.  MyChart/Babyscripts MyChart access verified. I explained pt will have some visits in office and some virtually. Babyscripts instructions given and order placed. Patient verifies receipt of registration text/e-mail. Account successfully created and app downloaded.  Blood Pressure Cuff/Weight Scale Blood pressure cuff ordered for patient to pick-up from [employer]. Explained after first prenatal appt pt will check weekly and document in 33. Patient does not have weight scale; patient may purchase if they desire to track weight weekly in Babyscripts.  Anatomy  US Explained first scheduled [place] will be around 19 weeks. Anatomy US scheduled for 19 wks at [facility]. Pt notified to arrive at [facility]BD.  Labs Discussed Johnsie Cancel genetic screening with patient. Would like both Panorama and Horizon drawn at new OB visit. Routine prenatal labs needed.  COVID Vaccine Patient has not had COVID vaccine.   Is patient a CenteringPregnancy candidate?  Declined Declined due to Schedule Not a candidate due to  NA  Social Determinants of Health Food Insecurity: Patient denies food insecurity. WIC Referral: Patient is interested in referral to [facility].  Transportation: Patient denies transportation needs. Childcare: Discussed no children allowed at ultrasound appointments. Offered childcare services; patient declines childcare services at this time.  First visit review I reviewed new OB appt with patient. I explained they will have a provider visit that includes Pap, GC/CC, OB Urine, Natera if desired. Explained pt will be seen by Dr. Rip Harbour at first visit; encounter routed to appropriate provider. Explained that patient will be seen by pregnancy navigator following visit with provider.   Penny Pia, RN 07/01/2022  8:36 AM

## 2022-07-02 LAB — CBC/D/PLT+RPR+RH+ABO+RUBIGG...
Antibody Screen: NEGATIVE
Basophils Absolute: 0 10*3/uL (ref 0.0–0.2)
Basos: 0 %
EOS (ABSOLUTE): 0.1 10*3/uL (ref 0.0–0.4)
Eos: 1 %
HCV Ab: NONREACTIVE
HIV Screen 4th Generation wRfx: NONREACTIVE
Hematocrit: 38.8 % (ref 34.0–46.6)
Hemoglobin: 14 g/dL (ref 11.1–15.9)
Hepatitis B Surface Ag: NEGATIVE
Immature Grans (Abs): 0 10*3/uL (ref 0.0–0.1)
Immature Granulocytes: 0 %
Lymphocytes Absolute: 1.5 10*3/uL (ref 0.7–3.1)
Lymphs: 29 %
MCH: 32.2 pg (ref 26.6–33.0)
MCHC: 36.1 g/dL — ABNORMAL HIGH (ref 31.5–35.7)
MCV: 89 fL (ref 79–97)
Monocytes Absolute: 0.3 10*3/uL (ref 0.1–0.9)
Monocytes: 5 %
NRBC: 1 % — ABNORMAL HIGH (ref 0–0)
Neutrophils Absolute: 3.3 10*3/uL (ref 1.4–7.0)
Neutrophils: 65 %
Platelets: 235 10*3/uL (ref 150–450)
RBC: 4.35 x10E6/uL (ref 3.77–5.28)
RDW: 11.8 % (ref 11.7–15.4)
RPR Ser Ql: NONREACTIVE
Rh Factor: POSITIVE
Rubella Antibodies, IGG: 13.5 index (ref >0.99)
WBC: 5.1 10*3/uL (ref 3.4–10.8)

## 2022-07-02 LAB — HCV INTERPRETATION

## 2022-07-12 ENCOUNTER — Inpatient Hospital Stay (HOSPITAL_COMMUNITY)
Admission: AD | Admit: 2022-07-12 | Discharge: 2022-07-12 | Disposition: A | Discharge status: Home / Self Care | Payer: Medicaid Other | Attending: Family Medicine | Admitting: Family Medicine

## 2022-07-12 ENCOUNTER — Inpatient Hospital Stay (HOSPITAL_COMMUNITY): Payer: Medicaid Other

## 2022-07-12 DIAGNOSIS — J189 Pneumonia, unspecified organism: Secondary | ICD-10-CM

## 2022-07-12 DIAGNOSIS — R0602 Shortness of breath: Secondary | ICD-10-CM | POA: Insufficient documentation

## 2022-07-12 DIAGNOSIS — Z1152 Encounter for screening for COVID-19: Secondary | ICD-10-CM | POA: Diagnosis not present

## 2022-07-12 DIAGNOSIS — O99511 Diseases of the respiratory system complicating pregnancy, first trimester: Secondary | ICD-10-CM

## 2022-07-12 DIAGNOSIS — R059 Cough, unspecified: Secondary | ICD-10-CM | POA: Diagnosis not present

## 2022-07-12 DIAGNOSIS — O26891 Other specified pregnancy related conditions, first trimester: Secondary | ICD-10-CM | POA: Diagnosis present

## 2022-07-12 DIAGNOSIS — Z3A11 11 weeks gestation of pregnancy: Secondary | ICD-10-CM | POA: Diagnosis not present

## 2022-07-12 DIAGNOSIS — Z3480 Encounter for supervision of other normal pregnancy, unspecified trimester: Secondary | ICD-10-CM

## 2022-07-12 LAB — COMPREHENSIVE METABOLIC PANEL
ALT: 15 U/L (ref 0–44)
AST: 19 U/L (ref 15–41)
Albumin: 3.1 g/dL — ABNORMAL LOW (ref 3.5–5.0)
Alkaline Phosphatase: 53 U/L (ref 38–126)
Anion gap: 9 (ref 5–15)
BUN: 9 mg/dL (ref 6–20)
CO2: 25 mmol/L (ref 22–32)
Calcium: 9 mg/dL (ref 8.9–10.3)
Chloride: 101 mmol/L (ref 98–111)
Creatinine, Ser: 0.58 mg/dL (ref 0.44–1.00)
GFR, Estimated: >60 mL/min (ref >60)
Glucose, Bld: 83 mg/dL (ref 70–99)
Potassium: 3.8 mmol/L (ref 3.5–5.1)
Sodium: 135 mmol/L (ref 135–145)
Total Bilirubin: 0.4 mg/dL (ref 0.3–1.2)
Total Protein: 6.8 g/dL (ref 6.5–8.1)

## 2022-07-12 LAB — RESP PANEL BY RT-PCR (RSV, FLU A&B, COVID)  RVPGX2
Influenza A by PCR: NEGATIVE
Influenza B by PCR: NEGATIVE
Resp Syncytial Virus by PCR: NEGATIVE
SARS Coronavirus 2 by RT PCR: NEGATIVE

## 2022-07-12 LAB — CBC WITH DIFFERENTIAL/PLATELET
Abs Immature Granulocytes: 0.04 10*3/uL (ref 0.00–0.07)
Basophils Absolute: 0 10*3/uL (ref 0.0–0.1)
Basophils Relative: 0 %
Eosinophils Absolute: 0.1 10*3/uL (ref 0.0–0.5)
Eosinophils Relative: 2 %
HCT: 37.5 % (ref 36.0–46.0)
Hemoglobin: 12.3 g/dL (ref 12.0–15.0)
Immature Granulocytes: 1 %
Lymphocytes Relative: 20 %
Lymphs Abs: 1.2 10*3/uL (ref 0.7–4.0)
MCH: 30.1 pg (ref 26.0–34.0)
MCHC: 32.8 g/dL (ref 30.0–36.0)
MCV: 91.9 fL (ref 80.0–100.0)
Monocytes Absolute: 0.3 10*3/uL (ref 0.1–1.0)
Monocytes Relative: 5 %
Neutro Abs: 4.4 10*3/uL (ref 1.7–7.7)
Neutrophils Relative %: 72 %
Platelets: 333 10*3/uL (ref 150–400)
RBC: 4.08 MIL/uL (ref 3.87–5.11)
RDW: 12.2 % (ref 11.5–15.5)
WBC: 6.1 10*3/uL (ref 4.0–10.5)
nRBC: 0 % (ref 0.0–0.2)

## 2022-07-12 LAB — URINALYSIS, ROUTINE W REFLEX MICROSCOPIC
Bilirubin Urine: NEGATIVE
Glucose, UA: NEGATIVE mg/dL
Hgb urine dipstick: NEGATIVE
Ketones, ur: NEGATIVE mg/dL
Leukocytes,Ua: NEGATIVE
Nitrite: NEGATIVE
Protein, ur: NEGATIVE mg/dL
Specific Gravity, Urine: 1.015 (ref 1.005–1.030)
pH: 5.5 (ref 5.0–8.0)

## 2022-07-12 MED ORDER — ALBUTEROL SULFATE HFA 108 (90 BASE) MCG/ACT IN AERS: 1.0000 | INHALATION_SPRAY | RESPIRATORY_TRACT | 1 refills | Status: DC | PRN

## 2022-07-12 MED ORDER — AMOXICILLIN-POT CLAVULANATE 875-125 MG PO TABS: 1.0000 | ORAL_TABLET | Freq: Two times a day (BID) | ORAL | 0 refills | Status: DC

## 2022-07-12 MED ORDER — GUAIFENESIN-DM 100-10 MG/5ML PO SYRP
10.0000 mL | ORAL_SOLUTION | Freq: Once | ORAL | Status: AC
  Administered 2022-07-12: 10 mL via ORAL
  Filled 2022-07-12: qty 10

## 2022-07-12 NOTE — MAU Provider Note (Signed)
History     CSN: [id number]  Arrival date and time: 07/12/22 0272   Event Date/Time   First Provider Initiated Contact with Patient 07/12/22 1010      Chief Complaint  Patient presents with   Cough   Shortness of Breath   Terri Manning is a 37 y.o. [id number] at [redacted]w[redacted]d who presents today with cough and shortness of breath x 3 weeks. She states that about 3 weeks ago the cough started. Her child was at their other parent's house and came home sick. She thinks it was covid as everyone at the other parent's house tested +, but she did not test. The cough was initially very dry, then productive. Then she was mostly better, but the cough has persisted. Now since about this weekend she has had pain along her side that she thinks is possibly from strained intercostals.   Cough This is a new problem. The current episode started 1 to 4 weeks ago. The problem has been waxing and waning. The cough is Non-productive. Associated symptoms include shortness of breath.  Shortness of Breath    OB History     Gravida  5   Para  3   Term  3   Preterm  0   AB  1   Living  3      SAB  1   IAB  0   Ectopic  0   Multiple  0   Live Births  3           Past Medical History:  Diagnosis Date   Herpes simplex virus (HSV) infection     Past Surgical History:  Procedure Laterality Date   APPENDECTOMY      No family history on file.  Social History   Tobacco Use   Smoking status: Never   Smokeless tobacco: Never  Substance Use Topics   Alcohol use: Yes    Alcohol/week: 2.0 standard drinks of alcohol    Types: 2 Glasses of wine per week   Drug use: Never    Allergies:  Allergies  Allergen Reactions   Ceclor [Cefaclor] Nausea And Vomiting    hallucinations   Doxycycline Hives   Erythromycin Hives    Medications Prior to Admission  Medication Sig Dispense Refill Last Dose   acetaminophen (TYLENOL) 325 MG tablet Take 2 tablets (650 mg total) by mouth every 4  (four) hours as needed (for pain scale < 4). (Patient not taking: Reported on 08/04/2021)      Blood Pressure Monitoring (BLOOD PRESSURE KIT) DEVI 1 Device by Does not apply route once a week. 1 each 0    ferrous sulfate 325 (65 FE) MG tablet Take 1 tablet (325 mg total) by mouth every other day. 30 tablet 0    ibuprofen (ADVIL) 600 MG tablet Take 1 tablet (600 mg total) by mouth every 6 (six) hours. 30 tablet 0    Prenatal Vit-Fe Fumarate-FA (PRENATAL PO) Take 1 tablet by mouth daily.      valACYclovir (VALTREX) 500 MG tablet Take 500 mg by mouth daily.       Review of Systems  Respiratory:  Positive for cough and shortness of breath.   All other systems reviewed and are negative.  Physical Exam   Blood pressure (!) 117/50, pulse 99, temperature 98 F (36.7 C), temperature source Oral, resp. rate 20, height 5\' 9"  (1.753 m), weight 63.8 kg, last menstrual period 04/24/2022, SpO2 98 %, currently breastfeeding.  Physical Exam Constitutional:  Appearance: She is well-developed.  HENT:     Head: Normocephalic.  Eyes:     Pupils: Pupils are equal, round, and reactive to light.  Cardiovascular:     Rate and Rhythm: Normal rate and regular rhythm.     Heart sounds: Normal heart sounds.  Pulmonary:     Effort: Pulmonary effort is normal. No respiratory distress.     Breath sounds: Normal breath sounds. No rhonchi or rales.  Chest:     Chest wall: No tenderness.  Abdominal:     Palpations: Abdomen is soft.     Tenderness: There is no abdominal tenderness.  Genitourinary:    Vagina: No bleeding. Vaginal discharge: mucusy.    Comments: External: no lesion Vagina: small amount of white discharge     Musculoskeletal:        General: Normal range of motion.     Cervical back: Normal range of motion and neck supple.  Skin:    General: Skin is warm and dry.  Neurological:     Mental Status: She is alert and oriented to person, place, and time.  Psychiatric:        Mood and  Affect: Mood normal.        Behavior: Behavior normal.    +FHT 164 with doppler   Results for orders placed or performed during the hospital encounter of 07/12/22 (from the past 24 hour(s))  Resp panel by RT-PCR (RSV, Flu A&B, Covid) Anterior Nasal Swab     Status: None   Collection Time: 07/12/22  8:44 AM   Specimen: Anterior Nasal Swab  Result Value Ref Range   SARS Coronavirus 2 by RT PCR NEGATIVE NEGATIVE   Influenza A by PCR NEGATIVE NEGATIVE   Influenza B by PCR NEGATIVE NEGATIVE   Resp Syncytial Virus by PCR NEGATIVE NEGATIVE  CBC with Differential/Platelet     Status: None   Collection Time: 07/12/22  9:17 AM  Result Value Ref Range   WBC 6.1 4.0 - 10.5 K/uL   RBC 4.08 3.87 - 5.11 MIL/uL   Hemoglobin 12.3 12.0 - 15.0 g/dL   HCT 37.5 36.0 - 46.0 %   MCV 91.9 80.0 - 100.0 fL   MCH 30.1 26.0 - 34.0 pg   MCHC 32.8 30.0 - 36.0 g/dL   RDW 12.2 11.5 - 15.5 %   Platelets 333 150 - 400 K/uL   nRBC 0.0 0.0 - 0.2 %   Neutrophils Relative % 72 %   Neutro Abs 4.4 1.7 - 7.7 K/uL   Lymphocytes Relative 20 %   Lymphs Abs 1.2 0.7 - 4.0 K/uL   Monocytes Relative 5 %   Monocytes Absolute 0.3 0.1 - 1.0 K/uL   Eosinophils Relative 2 %   Eosinophils Absolute 0.1 0.0 - 0.5 K/uL   Basophils Relative 0 %   Basophils Absolute 0.0 0.0 - 0.1 K/uL   Immature Granulocytes 1 %   Abs Immature Granulocytes 0.04 0.00 - 0.07 K/uL  Comprehensive metabolic panel     Status: Abnormal   Collection Time: 07/12/22  9:17 AM  Result Value Ref Range   Sodium 135 135 - 145 mmol/L   Potassium 3.8 3.5 - 5.1 mmol/L   Chloride 101 98 - 111 mmol/L   CO2 25 22 - 32 mmol/L   Glucose, Bld 83 70 - 99 mg/dL   BUN 9 6 - 20 mg/dL   Creatinine, Ser 0.58 0.44 - 1.00 mg/dL   Calcium 9.0 8.9 - 10.3 mg/dL   Total  Protein 6.8 6.5 - 8.1 g/dL   Albumin 3.1 (L) 3.5 - 5.0 g/dL   AST 19 15 - 41 U/L   ALT 15 0 - 44 U/L   Alkaline Phosphatase 53 38 - 126 U/L   Total Bilirubin 0.4 0.3 - 1.2 mg/dL   GFR, Estimated >60 >60  mL/min   Anion gap 9 5 - 15  Urinalysis, Routine w reflex microscopic Urine, Clean Catch     Status: None   Collection Time: 07/12/22  9:30 AM  Result Value Ref Range   Color, Urine YELLOW YELLOW   APPearance CLEAR CLEAR   Specific Gravity, Urine 1.015 1.005 - 1.030   pH 5.5 5.0 - 8.0   Glucose, UA NEGATIVE NEGATIVE mg/dL   Hgb urine dipstick NEGATIVE NEGATIVE   Bilirubin Urine NEGATIVE NEGATIVE   Ketones, ur NEGATIVE NEGATIVE mg/dL   Protein, ur NEGATIVE NEGATIVE mg/dL   Nitrite NEGATIVE NEGATIVE   Leukocytes,Ua NEGATIVE NEGATIVE   DG Chest 1 View  Result Date: 07/12/2022 CLINICAL DATA:  Chest pain EXAM: CHEST  1 VIEW COMPARISON:  None Available. FINDINGS: No pleural effusion. No pneumothorax. Normal cardiac and mediastinal contours. Focal opacity at the right lung base is worrisome infection. No displaced rib fractures. Visualized upper abdomen is unremarkable IMPRESSION: Focal opacity at the right lung base is worrisome for infection. Electronically Signed   By: Marin Roberts M.D.   On: 07/12/2022 11:06     MAU Course  Procedures  MDM DW patient that there is concern for secondary infection. She is uncertain about the x-ray and wanted to know alternatives. Offered to treat patient as though she has pneumonia as her history sounds like pneumonia, and start antibiotics. She would rather not take antibiotics if not necessary, so she consents to the xray.   X-ray results shows a right lower lung opacity which is consistent with pneumonia. Will treat with Augmentin 875 BID x 7 days and albuterol inhaler.   Assessment and Plan   1. Supervision of other normal pregnancy, antepartum   2. Pneumonia affecting pregnancy in first trimester   3. [redacted] weeks gestation of pregnancy    DC home in stable condition  Comfort measures reviewed  OTC med list given  1st/2nd Trimester precautions  RX: Augmentin 875 BID x 7 day, Albuterol inhaler q 6 hours  Work note given for return to work on  07/19/2022 Return to MAU as needed FU with OB as planned   [facility] Follow up.   Contact information: [address] [id number] Hobbs DNP, CNM  07/12/22  11:27 AM

## 2022-07-12 NOTE — MAU Note (Addendum)
Pt has agreed to go to x-ray. X-ray have been called

## 2022-07-12 NOTE — MAU Note (Signed)
.  Terri Manning is a 37 y.o. at [redacted]w[redacted]d here in MAU reporting: x3 weeks ago her whole household got sick. States she thinks they had covid but did not test. She has continued to have a cough and shortness of breath. She is now having sharp pain that goes under her left breast and around through her left shoulder blade. States she gets winded even in conversation. Denies VB or abdominal pain.   Pain score: 7 Vitals:   07/12/22 0839  BP: (!) 117/50  Pulse: 99  Resp: 20  Temp: 98 F (36.7 C)  SpO2: 98%     FHT:164 Lab orders placed from triage:  UA

## 2022-07-12 NOTE — Discharge Instructions (Signed)

## 2022-07-12 NOTE — MAU Note (Signed)
X-ray is sending transport to take pt. For x-ray

## 2022-07-12 NOTE — MAU Note (Addendum)
X-ray have arrived to pick up Pt. Pt. refused to go to x-ray until she talk to provider again, she would like to know what her other alteratives are. Pt. States no one is listening to her they just want to send her to x-ray. Pt. Was told provider is seeing other pts at this time and it may take a while before provider is able to talk to her again, pt states she will wait.

## 2022-07-20 ENCOUNTER — Ambulatory Visit (INDEPENDENT_AMBULATORY_CARE_PROVIDER_SITE_OTHER): Payer: Medicaid Other

## 2022-07-20 ENCOUNTER — Other Ambulatory Visit (HOSPITAL_COMMUNITY)
Admission: RE | Admit: 2022-07-20 | Discharge: 2022-07-20 | Disposition: A | Discharge status: Home / Self Care | Payer: Medicaid Other | Source: Ambulatory Visit | Attending: Obstetrics and Gynecology | Admitting: Obstetrics and Gynecology

## 2022-07-20 VITALS — BP 109/74 | HR 86 | Ht 69.0 in | Wt 142.6 lb

## 2022-07-20 DIAGNOSIS — Z3A12 12 weeks gestation of pregnancy: Secondary | ICD-10-CM | POA: Diagnosis not present

## 2022-07-20 DIAGNOSIS — Z3480 Encounter for supervision of other normal pregnancy, unspecified trimester: Secondary | ICD-10-CM | POA: Diagnosis present

## 2022-07-20 DIAGNOSIS — Z3481 Encounter for supervision of other normal pregnancy, first trimester: Secondary | ICD-10-CM | POA: Diagnosis not present

## 2022-07-20 LAB — GLUCOSE, POCT (MANUAL RESULT ENTRY): POC Glucose: 83 mg/dl (ref 70–99)

## 2022-07-20 NOTE — Progress Notes (Signed)
Pt presents for new OB visit. Pt would like to wait until PP for her PAP. Pt requesting referral for pelvic floor therapy. No other concerns at this time.

## 2022-07-20 NOTE — Patient Instructions (Signed)

## 2022-07-20 NOTE — Progress Notes (Signed)
Subjective:   Terri Manning is a 37 y.o. [id number] at [redacted]w[redacted]d by LMP being seen today for her first obstetrical visit.  Her obstetrical history is significant for  n/a  and has History of delivery of macrosomal infant; Genital herpes affecting pregnancy, antepartum; and Supervision of other normal pregnancy, antepartum on their problem list.. Patient does intend to breast feed. Pregnancy history fully reviewed.  Patient reports  some symphysis pubis pain already, requesting referral to PT .  HISTORY: OB History  Gravida Para Term Preterm AB Living  5 3 3  0 1 3  SAB IAB Ectopic Multiple Live Births  1 0 0 0 3    # Outcome Date GA Lbr Len/2nd Weight Sex Delivery Anes PTL Lv  5 Current           4 Term 05/29/21 [redacted]w[redacted]d 03:19 / 00:14 7 lb (3.175 kg) M Vag-Spont None  LIV     Name: Montfort,BOY Tyjah     Apgar1: 8  Apgar5: 8  3 Term 04/17/20 [redacted]w[redacted]d  8 lb (3.629 kg) M Vag-Spont EPI N LIV  2 Term 10/02/12 [redacted]w[redacted]d  10 lb (4.536 kg) M Vag-Spont EPI N LIV  1 SAB            Past Medical History:  Diagnosis Date   Herpes simplex virus (HSV) infection    Past Surgical History:  Procedure Laterality Date   APPENDECTOMY     History reviewed. No pertinent family history. Social History   Tobacco Use   Smoking status: Never   Smokeless tobacco: Never  Vaping Use   Vaping Use: Never used  Substance Use Topics   Alcohol use: Not Currently    Alcohol/week: 2.0 standard drinks of alcohol    Types: 2 Glasses of wine per week   Drug use: Never   Allergies  Allergen Reactions   Ceclor [Cefaclor] Nausea And Vomiting    hallucinations   Doxycycline Hives   Erythromycin Hives   Current Outpatient Medications on File Prior to Visit  Medication Sig Dispense Refill   ferrous sulfate 325 (65 FE) MG tablet Take 1 tablet (325 mg total) by mouth every other day. 30 tablet 0   Prenatal Vit-Fe Fumarate-FA (PRENATAL PO) Take 1 tablet by mouth daily.     acetaminophen (TYLENOL) 325 MG tablet Take 2  tablets (650 mg total) by mouth every 4 (four) hours as needed (for pain scale < 4). (Patient not taking: Reported on 08/04/2021)     albuterol (VENTOLIN HFA) 108 (90 Base) MCG/ACT inhaler Inhale 1-2 puffs into the lungs every 4 (four) hours as needed for wheezing or shortness of breath. (Patient not taking: Reported on 07/20/2022) 1 each 1   amoxicillin-clavulanate (AUGMENTIN) 875-125 MG tablet Take 1 tablet by mouth every 12 (twelve) hours. (Patient not taking: Reported on 07/20/2022) 14 tablet 0   Blood Pressure Monitoring (BLOOD PRESSURE KIT) DEVI 1 Device by Does not apply route once a week. 1 each 0   ibuprofen (ADVIL) 600 MG tablet Take 1 tablet (600 mg total) by mouth every 6 (six) hours. 30 tablet 0   valACYclovir (VALTREX) 500 MG tablet Take 500 mg by mouth daily.     No current facility-administered medications on file prior to visit.     Indications for ASA therapy (per UpToDate) One of the following: Previous pregnancy with preeclampsia, especially early onset and with an adverse outcome No Multifetal gestation No Chronic hypertension No Type 1 or 2 diabetes mellitus No Chronic  kidney disease No Autoimmune disease (antiphospholipid syndrome, systemic lupus erythematosus) No Two or more of the following: Nulliparity No Obesity (body mass index >30 kg/m2) No Family history of preeclampsia in mother or sister No Age ?35 years No Sociodemographic characteristics (African American race, low socioeconomic level) No Personal risk factors (eg, previous pregnancy with low birth weight or small for gestational age infant, previous adverse pregnancy outcome [eg, stillbirth], interval >10 years between pregnancies) No  Indications for early GDM screening (FBS, A1C, Random CBG, GTT) First-degree relative with diabetes No BMI >30kg/m2 No Age > 35 No Previous birth of an infant weighing ?4000 g Yes Gestational diabetes mellitus in a previous pregnancy No Glycated hemoglobin ?5.7 percent  (39 mmol/mol), impaired glucose tolerance, or impaired fasting glucose on previous testing No High-risk race/ethnicity (eg, African American, Latino, Native American, Asian American, Pacific Islander) No Previous stillbirth of unknown cause No Maternal birthweight > 9 lbs No History of cardiovascular disease No Hypertension or on therapy for hypertension No High-density lipoprotein cholesterol level <35 mg/dL (0.90 mmol/L) and/or a triglyceride level >250 mg/dL (2.82 mmol/L) No Polycystic ovary syndrome No Physical inactivity No Other clinical condition associated with insulin resistance (eg, severe obesity, acanthosis nigricans) No Current use of glucocorticoids No   Exam   Vitals:   07/20/22 0914  BP: 109/74  Pulse: 86  Weight: 142 lb 9.6 oz (64.7 kg)      Uterus:     Pelvic Exam: declines                    Bony Pelvis: average  System: General: well-developed, well-nourished female in no acute distress   Breast:  normal appearance, no masses or tenderness   Skin: normal coloration and turgor, no rashes   Neurologic: oriented, normal, negative, normal mood   Extremities: normal strength, tone, and muscle mass, ROM of all joints is normal   HEENT PERRLA, extraocular movement intact and sclera clear, anicteric   Mouth/Teeth mucous membranes moist, pharynx normal without lesions and dental hygiene good   Neck supple and no masses   Cardiovascular: regular rate and rhythm   Respiratory:  no respiratory distress, normal breath sounds   Abdomen: soft, non-tender; bowel sounds normal; no masses,  no organomegaly     Assessment:   Pregnancy: [id number] Patient Active Problem List   Diagnosis Date Noted   Supervision of other normal pregnancy, antepartum 07/01/2022   History of delivery of macrosomal infant 04/07/2021   Genital herpes affecting pregnancy, antepartum 04/07/2021     Plan:  1. Supervision of other normal pregnancy, antepartum -Patient reports feeling much  better since she is out of the first trimester.  -Referral to PT placed. Patient feels that helped during her last pregnancy.  -Discussed [facility] schedule and patient interested. Order placed and will bring patient back on [facility] schedule. Next visit at [redacted] weeks gestation -Patient due for pap smear today- declines. States she prefers to have paps done when not pregnant.  -Declines genetics -Anatomy scan scheduled 3/18 -Hx of 10lb baby with first delivery, no gestational diabetes, no difficulty with deliveries. Will monitor EFW per MFM and fundal heights. Will be AMA at time of delivery.   -Patient desires waterbirth again this pregnancy. Had successful WB with last pregnancy and took class in 2022. Will schedule with CNMs as much as possible - Pt interested in waterbirth and has attended the class.  - Reviewed conditions in labor that will risk her out of water immersion including thick meconium  or blood stained amniotic fluid, non-reassuring fetal status on monitor, excessive bleeding, hypertension, dizziness, use of IV meds, damaged equipment or staffing that does not allow for water immersion, etc.  - The attending midwife must be on the unit for water immersion to begin; pt understands this may delay the start of water immersion. - Reminded pt that signing consent in labor at the hospital also acknowledges they will exit the tub if the attending midwife requests. - Consent given to patient for review.  Consent will be reviewed and signed at the hospital by the waterbirth provider prior to use of the tub. - Discussed other labor support options if waterbirth becomes unavailable, including position change, freedom of movement, use of birthing ball, and/or use of hydrotherapy in the shower (dependent upon medical condition/provider discretion).     - CBG normal today for hx infant >4000g - Culture, OB Urine - Cervicovaginal ancillary only - Ambulatory referral to Physical Therapy  2. [redacted] weeks  gestation of pregnancy - Ambulatory referral to Physical Therapy   Initial labs drawn and normal Continue prenatal vitamins. Genetic Screening discussed, First trimester screen, Quad screen, and NIPS: declined. Ultrasound discussed; fetal anatomic survey: ordered. Problem list reviewed and updated. The nature of [facility] with multiple MDs and other Advanced Practice Providers was explained to patient; also emphasized that residents, students are part of our team. Routine obstetric precautions reviewed. Return in about 4 weeks (around 08/17/2022) for Return OB visit.   Wende Mott, CNM 07/20/22 9:32 AM

## 2022-07-21 ENCOUNTER — Encounter: Payer: Medicaid Other | Admitting: Obstetrics and Gynecology

## 2022-07-21 LAB — CERVICOVAGINAL ANCILLARY ONLY
Chlamydia: NEGATIVE
Comment: NEGATIVE
Comment: NEGATIVE
Comment: NORMAL
Neisseria Gonorrhea: NEGATIVE
Trichomonas: NEGATIVE

## 2022-07-21 NOTE — Therapy (Unsigned)
OUTPATIENT PHYSICAL THERAPY FEMALE PELVIC EVALUATION   Patient Name: Terri Manning MRN: [id number] DOB:Apr 27, 1986, 37 y.o., female Today's Date: 07/22/2022  END OF SESSION:  PT End of Session - 07/22/22 0843     Visit Number 1    Date for PT Re-Evaluation 11/20/22    Authorization Type [employer] - submitted auth today    PT Start Time 0806    PT Stop Time 0846    PT Time Calculation (min) 40 min    Activity Tolerance Patient tolerated treatment well    Behavior During Therapy [facility] for tasks assessed/performed             Past Medical History:  Diagnosis Date   Herpes simplex virus (HSV) infection    Past Surgical History:  Procedure Laterality Date   APPENDECTOMY     Patient Active Problem List   Diagnosis Date Noted   Supervision of other normal pregnancy, antepartum 07/01/2022   History of delivery of macrosomal infant 04/07/2021   Genital herpes affecting pregnancy, antepartum 04/07/2021    PCP: shea church, PA-C  REFERRING PROVIDER: Wende Mott, CNM   REFERRING DIAG:  Z34.80 (ICD-10-CM) - Supervision of other normal pregnancy, antepartum  Z3A.12 (ICD-10-CM) - [redacted] weeks gestation of pregnancy    THERAPY DIAG:  Muscle weakness (generalized)  Abnormal posture  Unspecified lack of coordination  Other muscle spasm  Rationale for Evaluation and Treatment: Rehabilitation  ONSET DATE: current episode started a couple weeks ago  SUBJECTIVE:                                                                                                                                                                                           SUBJECTIVE STATEMENT: I am in the process of getting a new job and have flexibility now to come to appts.  I have pelvic PT in [place] after 2nd baby.  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  My pelvic floor was very tight and painful to use bathroom after baby #2 and #3.  Now I am 13 weeks with baby #4 and having the same thing.  It  is not as intense but it is happening sooner than previous pregnancy.  I am having rectal pain that is sharp and shooting pain Fluid intake: Yes: lots of water    PAIN:  Are you having pain? Yes NPRS scale: 3/10 Pain location: Vaginal  Pain type: pressure Pain description: intermittent   Aggravating factors: more when sitting Relieving factors: lying down, get up from sitting  PRECAUTIONS: None  WEIGHT BEARING RESTRICTIONS: No  FALLS:  Has patient fallen in last 6 months? No  LIVING ENVIRONMENT: Lives with: lives with their family Lives in: House/apartment   OCCUPATION: accounting  PLOF: Independent  PATIENT GOALS: not have pain exercise and have healthy pregnancy  PERTINENT HISTORY:  3 vaginal deliveries Sexual abuse: No  BOWEL MOVEMENT: Pain with bowel movement: Yes sometimes Type of bowel movement:Type (Bristol Stool Scale) 4, Frequency normal, and Strain No Fully empty rectum: Yes:   Leakage: No Pads: No Fiber supplement: No  URINATION: Pain with urination: No Fully empty bladder: Yes:   Stream: Strong Urgency: No Frequency: normal Leakage: Coughing and Sneezing Pads: No  INTERCOURSE: Pain with intercourse: During Penetration Ability to have vaginal penetration:  No Climax: too uncomfortable Marinoff Scale: 3/3  PREGNANCY: Vaginal deliveries 3 Tearing Yes: 2 stitches after the first C-section deliveries 0 Currently pregnant Yes: 13 weeks  PROLAPSE: None   OBJECTIVE:   DIAGNOSTIC FINDINGS:    PATIENT SURVEYS:    PFIQ-7 = 29  COGNITION: Overall cognitive status: Within functional limits for tasks assessed     SENSATION: Light touch:  Proprioception:   MUSCLE LENGTH: Hamstrings: Right 90 deg; Left 90 deg Thomas test:   LUMBAR SPECIAL TESTS:  ASLR twinge of pain with compression and Lt leg with back support  FUNCTIONAL TESTS:  Single leg stand normal  GAIT:  Comments: hip drop on left stance phase  POSTURE: left  pelvic obliquity due to Lt LLD  PELVIC ALIGNMENT:normal in supine  LUMBARAROM/PROM:  A/PROM A/PROM  eval  Flexion WFL   Extension   Right lateral flexion   Left lateral flexion   Right rotation   Left rotation    (Blank rows = not tested)  LOWER EXTREMITY ROM:  Passive ROM Right eval Left eval  Hip flexion    Hip extension    Hip abduction    Hip adduction    Hip internal rotation    Hip external rotation 75% 75%  Knee flexion    Knee extension    Ankle dorsiflexion    Ankle plantarflexion    Ankle inversion    Ankle eversion     (Blank rows = not tested)  LOWER EXTREMITY MMT: Rt leg 4-/5  MMT Right eval Left eval  Hip flexion 5 5  Hip extension 4-/5 5  Hip abduction 4-/5 4+/5  Hip adduction 4-/5 4+/5  Hip internal rotation 4/5 5  Hip external rotation 4/5 5  Knee flexion 5 5  Knee extension 5 5  Ankle dorsiflexion    Ankle plantarflexion    Ankle inversion    Ankle eversion     PALPATION:   General  tight lumbar left and gluteals tight                External Perineal Exam bilateral bulging and mild tenderness                             Internal Pelvic Floor high tone and tender levators bil and muscles feel shortened and thickened  Patient confirms identification and approves PT to assess internal pelvic floor and treatment Yes  PELVIC MMT:   MMT eval  Vaginal 4/5 x 5 reps; hold for 4 sec  Internal Anal Sphincter   External Anal Sphincter   Puborectalis   Diastasis Recti above and below umbilicus 3.7-3 finger width, 1 knuckle depth  (Blank rows = not tested)        TONE: high  PROLAPSE: Mild not even grad 1 of anterior wall  TODAY'S TREATMENT:                                                                                                                              DATE: 07/22/22  EVAL and education given on plan of care   PATIENT EDUCATION:  Education details: plan of care Person educated: Patient Education method:  Explanation Education comprehension: verbalized understanding and needs further education  HOME EXERCISE PROGRAM: HEP not issued today  ASSESSMENT:  CLINICAL IMPRESSION: Patient is a 37 y.o. female who was seen today for physical therapy evaluation and treatment for pelvic pain. Pt is [redacted] weeks gestation and has had 3 previous vaginal deliveries. Pt is having pelvic pain that she has experienced in the past and knows has gotten worse during pregnancy.  Pt has pelvic obliquity due to left leg length discrepancy.  She has tight gluteals and external hip rotation limited.  Pt has tight hamstrings bilaterally.  Pt has more pain with compression doing ASLR demonstrating pain worsens when abdominals are more active.  Pt has very high tone and shortened pelvic floor levators.  Tender to palpation bilaterally.  Pt has strong pelvic floor closure but lower endurance and is leaking with coughing.  Pt also unable to tolerate penetration for intercourse due to pain. Pt will benefit from skilled PT to address all above impairments for improved function and pain management.  OBJECTIVE IMPAIRMENTS: Abnormal gait, decreased activity tolerance, decreased coordination, decreased endurance, decreased ROM, decreased strength, increased muscle spasms, impaired flexibility, impaired tone, postural dysfunction, and pain.   ACTIVITY LIMITATIONS: sitting, standing, continence, and toileting  PARTICIPATION LIMITATIONS: interpersonal relationship, community activity, and occupation  PERSONAL FACTORS: 1-2 comorbidities: [redacted] weeks gestation, 3 vaginal deliveries, leg length discrepancy  are also affecting patient's functional outcome.   REHAB POTENTIAL: Excellent  CLINICAL DECISION MAKING: Evolving/moderate complexity  EVALUATION COMPLEXITY: Moderate   GOALS: Goals reviewed with patient? Yes  SHORT TERM GOALS: Target date: 08/19/22  Ind with initial HEP Baseline: Goal status: INITIAL    LONG TERM GOALS: Target  date: 11/20/22  Ind with HEP for healthy exercise throughout pregnancy Baseline:  Goal status: INITIAL  2.  Pt understands and aware of active labor positions for preparation Baseline:  Goal status: INITIAL  3.  Pt will be able to functional actions such as cough and sneeze without leakage  Baseline:  Goal status: INITIAL  4.  Pt will have 1/3 or less score of Marinoff scale  Baseline: 3/3 Goal status: INITIAL  5.  Pt will report 50% reduction of pain due to improvements in posture, strength, and muscle length and maintain as advancing gestation Baseline:  Goal status: INITIAL    PLAN:  PT FREQUENCY: 1x/week  PT DURATION: 4 months  PLANNED INTERVENTIONS: Therapeutic exercises, Therapeutic activity, Neuromuscular re-education, Balance training, Gait training, Patient/Family education, Self Care, Joint mobilization, Aquatic Therapy, Electrical stimulation, Cryotherapy, Moist heat, Taping, Biofeedback, Manual therapy, and Re-evaluation  PLAN FOR NEXT SESSION: heel lift  info, soft tissue work internally and work on relax and bulge, stretches, core brace with exhale   [employer], PT 07/22/2022, 9:21 AM

## 2022-07-22 ENCOUNTER — Ambulatory Visit: Payer: Medicaid Other | Admitting: Physical Therapy

## 2022-07-22 ENCOUNTER — Encounter: Payer: Self-pay | Admitting: Physical Therapy

## 2022-07-22 DIAGNOSIS — M62838 Other muscle spasm: Secondary | ICD-10-CM | POA: Insufficient documentation

## 2022-07-22 DIAGNOSIS — R293 Abnormal posture: Secondary | ICD-10-CM | POA: Diagnosis present

## 2022-07-22 DIAGNOSIS — Z3481 Encounter for supervision of other normal pregnancy, first trimester: Secondary | ICD-10-CM | POA: Insufficient documentation

## 2022-07-22 DIAGNOSIS — R279 Unspecified lack of coordination: Secondary | ICD-10-CM | POA: Diagnosis present

## 2022-07-22 DIAGNOSIS — Z3A12 12 weeks gestation of pregnancy: Secondary | ICD-10-CM | POA: Diagnosis not present

## 2022-07-22 DIAGNOSIS — M6281 Muscle weakness (generalized): Secondary | ICD-10-CM | POA: Diagnosis present

## 2022-07-22 DIAGNOSIS — Z3480 Encounter for supervision of other normal pregnancy, unspecified trimester: Secondary | ICD-10-CM | POA: Insufficient documentation

## 2022-07-22 LAB — URINE CULTURE, OB REFLEX: Organism ID, Bacteria: NO GROWTH

## 2022-07-22 LAB — CULTURE, OB URINE

## 2022-08-12 NOTE — Therapy (Incomplete)
OUTPATIENT PHYSICAL THERAPY FEMALE PELVIC TREATMENT   Patient Name: Terri Manning MRN: [id number] DOB:1986-05-01, 37 y.o., female Today's Date: 08/12/2022  END OF SESSION:    Past Medical History:  Diagnosis Date   Herpes simplex virus (HSV) infection    Past Surgical History:  Procedure Laterality Date   APPENDECTOMY     Patient Active Problem List   Diagnosis Date Noted   Supervision of other normal pregnancy, antepartum 07/01/2022   History of delivery of macrosomal infant 04/07/2021   Genital herpes affecting pregnancy, antepartum 04/07/2021    PCP: shea church, PA-C  REFERRING PROVIDER: Wende Mott, CNM   REFERRING DIAG:  Z34.80 (ICD-10-CM) - Supervision of other normal pregnancy, antepartum  Z3A.12 (ICD-10-CM) - [redacted] weeks gestation of pregnancy    THERAPY DIAG:  No diagnosis found.  Rationale for Evaluation and Treatment: Rehabilitation  ONSET DATE: current episode started a couple weeks ago  SUBJECTIVE:                                                                                                                                                                                           SUBJECTIVE STATEMENT: I have one more week of work.  Pain varies Fluid intake: Yes: lots of water    PAIN:  Are you having pain? Yes NPRS scale: 1-2/10 Pain location: Vaginal  Pain type: pressure Pain description: intermittent   Aggravating factors: more when sitting Relieving factors: lying down, get up from sitting  PRECAUTIONS: None  WEIGHT BEARING RESTRICTIONS: No  FALLS:  Has patient fallen in last 6 months? No  LIVING ENVIRONMENT: Lives with: lives with their family Lives in: House/apartment   OCCUPATION: accounting  PLOF: Independent  PATIENT GOALS: not have pain exercise and have healthy pregnancy  PERTINENT HISTORY:  3 vaginal deliveries Sexual abuse: No  BOWEL MOVEMENT: Pain with bowel movement: Yes sometimes Type of bowel  movement:Type (Bristol Stool Scale) 4, Frequency normal, and Strain No Fully empty rectum: Yes:   Leakage: No Pads: No Fiber supplement: No  URINATION: Pain with urination: No Fully empty bladder: Yes:   Stream: Strong Urgency: No Frequency: normal Leakage: Coughing and Sneezing Pads: No  INTERCOURSE: Pain with intercourse: During Penetration Ability to have vaginal penetration:  No Climax: too uncomfortable Marinoff Scale: 3/3  PREGNANCY: Vaginal deliveries 3 Tearing Yes: 2 stitches after the first C-section deliveries 0 Currently pregnant Yes: 13 weeks  PROLAPSE: None   OBJECTIVE:   DIAGNOSTIC FINDINGS:    PATIENT SURVEYS:    PFIQ-7 = 29  COGNITION: Overall cognitive status: Within functional limits for tasks assessed     SENSATION: Light touch:  Proprioception:   MUSCLE LENGTH: Hamstrings: Right 90 deg; Left 90 deg Thomas test:   LUMBAR SPECIAL TESTS:  ASLR twinge of pain with compression and Lt leg with back support  FUNCTIONAL TESTS:  Single leg stand normal  GAIT:  Comments: hip drop on left stance phase  POSTURE: left pelvic obliquity due to Lt LLD  PELVIC ALIGNMENT:normal in supine  LUMBARAROM/PROM:  A/PROM A/PROM  eval  Flexion WFL   Extension   Right lateral flexion   Left lateral flexion   Right rotation   Left rotation    (Blank rows = not tested)  LOWER EXTREMITY ROM:  Passive ROM Right eval Left eval  Hip flexion    Hip extension    Hip abduction    Hip adduction    Hip internal rotation    Hip external rotation 75% 75%  Knee flexion    Knee extension    Ankle dorsiflexion    Ankle plantarflexion    Ankle inversion    Ankle eversion     (Blank rows = not tested)  LOWER EXTREMITY MMT: Rt leg 4-/5  MMT Right eval Left eval  Hip flexion 5 5  Hip extension 4-/5 5  Hip abduction 4-/5 4+/5  Hip adduction 4-/5 4+/5  Hip internal rotation 4/5 5  Hip external rotation 4/5 5  Knee flexion 5 5  Knee  extension 5 5  Ankle dorsiflexion    Ankle plantarflexion    Ankle inversion    Ankle eversion     PALPATION:   General  tight lumbar left and gluteals tight                External Perineal Exam bilateral bulging and mild tenderness                             Internal Pelvic Floor high tone and tender levators bil and muscles feel shortened and thickened  Patient confirms identification and approves PT to assess internal pelvic floor and treatment Yes  PELVIC MMT:   MMT eval  Vaginal 4/5 x 5 reps; hold for 4 sec  Internal Anal Sphincter   External Anal Sphincter   Puborectalis   Diastasis Recti above and below umbilicus [id number] finger width, 1 knuckle depth  (Blank rows = not tested)        TONE: high  PROLAPSE: Mild not even grad 1 of anterior wall  TODAY'S TREATMENT:                                                                                                                              DATE: 07/22/22  EVAL and education given on plan of care   PATIENT EDUCATION:  Education details: plan of care Person educated: Patient Education method: Explanation Education comprehension: verbalized understanding and needs further education  HOME EXERCISE PROGRAM: HEP not issued today  ASSESSMENT:  CLINICAL IMPRESSION: Patient is a  37 y.o. female who was seen today for physical therapy treatment for pelvic pain. Pt is [redacted] weeks gestation   Pt will benefit from skilled PT to address strength and coordination for improved function and pain management.  OBJECTIVE IMPAIRMENTS: Abnormal gait, decreased activity tolerance, decreased coordination, decreased endurance, decreased ROM, decreased strength, increased muscle spasms, impaired flexibility, impaired tone, postural dysfunction, and pain.   ACTIVITY LIMITATIONS: sitting, standing, continence, and toileting  PARTICIPATION LIMITATIONS: interpersonal relationship, community activity, and occupation  PERSONAL FACTORS: 1-2  comorbidities: [redacted] weeks gestation, 3 vaginal deliveries, leg length discrepancy  are also affecting patient's functional outcome.   REHAB POTENTIAL: Excellent  CLINICAL DECISION MAKING: Evolving/moderate complexity  EVALUATION COMPLEXITY: Moderate   GOALS: Goals reviewed with patient? Yes  SHORT TERM GOALS: Target date: 08/19/22  Ind with initial HEP Baseline: Goal status: IN PROGRESS    LONG TERM GOALS: Target date: 11/20/22  Ind with HEP for healthy exercise throughout pregnancy Baseline:  Goal status: INITIAL  2.  Pt understands and aware of active labor positions for preparation Baseline:  Goal status: INITIAL  3.  Pt will be able to functional actions such as cough and sneeze without leakage  Baseline:  Goal status: INITIAL  4.  Pt will have 1/3 or less score of Marinoff scale  Baseline: 3/3 Goal status: INITIAL  5.  Pt will report 50% reduction of pain due to improvements in posture, strength, and muscle length and maintain as advancing gestation Baseline:  Goal status: INITIAL    PLAN:  PT FREQUENCY: 1x/week  PT DURATION: 4 months  PLANNED INTERVENTIONS: Therapeutic exercises, Therapeutic activity, Neuromuscular re-education, Balance training, Gait training, Patient/Family education, Self Care, Joint mobilization, Aquatic Therapy, Electrical stimulation, Cryotherapy, Moist heat, Taping, Biofeedback, Manual therapy, and Re-evaluation  PLAN FOR NEXT SESSION: heel lift info, soft tissue work internally and work on relax and bulge, stretches, core brace with exhale   [employer], PT 08/12/2022, 4:04 PM

## 2022-08-13 ENCOUNTER — Ambulatory Visit: Payer: Medicaid Other | Admitting: Physical Therapy

## 2022-08-13 ENCOUNTER — Encounter: Payer: Self-pay | Admitting: Physical Therapy

## 2022-08-13 DIAGNOSIS — R279 Unspecified lack of coordination: Secondary | ICD-10-CM

## 2022-08-13 DIAGNOSIS — M6281 Muscle weakness (generalized): Secondary | ICD-10-CM

## 2022-08-13 DIAGNOSIS — Z3481 Encounter for supervision of other normal pregnancy, first trimester: Secondary | ICD-10-CM | POA: Diagnosis not present

## 2022-08-13 DIAGNOSIS — R293 Abnormal posture: Secondary | ICD-10-CM

## 2022-08-13 DIAGNOSIS — M62838 Other muscle spasm: Secondary | ICD-10-CM

## 2022-08-13 NOTE — Therapy (Signed)
OUTPATIENT PHYSICAL THERAPY FEMALE PELVIC TREATMENT   Patient Name: Terri Manning MRN: [id number] DOB:1986/06/12, 37 y.o., female Today's Date: 08/13/2022  END OF SESSION:  PT End of Session - 08/13/22 0855     Visit Number 2    Date for PT Re-Evaluation 11/20/22    Authorization Type [employer] - submitted auth today    PT Start Time 0850   late   PT Stop Time 0928    PT Time Calculation (min) 38 min    Activity Tolerance Patient tolerated treatment well    Behavior During Therapy [facility] for tasks assessed/performed              Past Medical History:  Diagnosis Date  . Herpes simplex virus (HSV) infection    Past Surgical History:  Procedure Laterality Date  . APPENDECTOMY     Patient Active Problem List   Diagnosis Date Noted  . Supervision of other normal pregnancy, antepartum 07/01/2022  . History of delivery of macrosomal infant 04/07/2021  . Genital herpes affecting pregnancy, antepartum 04/07/2021    PCP: shea church, PA-C  REFERRING PROVIDER: Wende Mott, CNM   REFERRING DIAG:  [phone] (ICD-10-CM) - Supervision of other normal pregnancy, antepartum  Z3A.12 (ICD-10-CM) - [redacted] weeks gestation of pregnancy    THERAPY DIAG:  Muscle weakness (generalized)  Abnormal posture  Unspecified lack of coordination  Other muscle spasm  Rationale for Evaluation and Treatment: Rehabilitation  ONSET DATE: current episode started a couple weeks ago  SUBJECTIVE:                                                                                                                                                                                           SUBJECTIVE STATEMENT: I have one more week of work.  Pain varies Fluid intake: Yes: lots of water    PAIN:  Are you having pain? Yes NPRS scale: 1-2/10 Pain location: Vaginal  Pain type: pressure Pain description: intermittent   Aggravating factors: more when sitting Relieving factors: lying down, get up  from sitting  PRECAUTIONS: None  WEIGHT BEARING RESTRICTIONS: No  FALLS:  Has patient fallen in last 6 months? No  LIVING ENVIRONMENT: Lives with: lives with their family Lives in: House/apartment   OCCUPATION: accounting  PLOF: Independent  PATIENT GOALS: not have pain exercise and have healthy pregnancy  PERTINENT HISTORY:  3 vaginal deliveries Sexual abuse: No  BOWEL MOVEMENT: Pain with bowel movement: Yes sometimes Type of bowel movement:Type (Bristol Stool Scale) 4, Frequency normal, and Strain No Fully empty rectum: Yes:   Leakage: No Pads: No Fiber supplement: No  URINATION: Pain with  urination: No Fully empty bladder: Yes:   Stream: Strong Urgency: No Frequency: normal Leakage: Coughing and Sneezing Pads: No  INTERCOURSE: Pain with intercourse: During Penetration Ability to have vaginal penetration:  No Climax: too uncomfortable Marinoff Scale: 3/3  PREGNANCY: Vaginal deliveries 3 Tearing Yes: 2 stitches after the first C-section deliveries 0 Currently pregnant Yes: 13 weeks  PROLAPSE: None   OBJECTIVE:   DIAGNOSTIC FINDINGS:    PATIENT SURVEYS:    PFIQ-7 = 29  COGNITION: Overall cognitive status: Within functional limits for tasks assessed     SENSATION: Light touch:  Proprioception:   MUSCLE LENGTH: Hamstrings: Right 90 deg; Left 90 deg Thomas test:   LUMBAR SPECIAL TESTS:  ASLR twinge of pain with compression and Lt leg with back support  FUNCTIONAL TESTS:  Single leg stand normal  GAIT:  Comments: hip drop on left stance phase  POSTURE: left pelvic obliquity due to Lt LLD  PELVIC ALIGNMENT:normal in supine  LUMBARAROM/PROM:  A/PROM A/PROM  eval  Flexion WFL   Extension   Right lateral flexion   Left lateral flexion   Right rotation   Left rotation    (Blank rows = not tested)  LOWER EXTREMITY ROM:  Passive ROM Right eval Left eval  Hip flexion    Hip extension    Hip abduction    Hip adduction     Hip internal rotation    Hip external rotation 75% 75%  Knee flexion    Knee extension    Ankle dorsiflexion    Ankle plantarflexion    Ankle inversion    Ankle eversion     (Blank rows = not tested)  LOWER EXTREMITY MMT: Rt leg 4-/5  MMT Right eval Left eval  Hip flexion 5 5  Hip extension 4-/5 5  Hip abduction 4-/5 4+/5  Hip adduction 4-/5 4+/5  Hip internal rotation 4/5 5  Hip external rotation 4/5 5  Knee flexion 5 5  Knee extension 5 5  Ankle dorsiflexion    Ankle plantarflexion    Ankle inversion    Ankle eversion     PALPATION:   General  tight lumbar left and gluteals tight                External Perineal Exam bilateral bulging and mild tenderness                             Internal Pelvic Floor high tone and tender levators bil and muscles feel shortened and thickened  Patient confirms identification and approves PT to assess internal pelvic floor and treatment Yes  PELVIC MMT:   MMT eval  Vaginal 4/5 x 5 reps; hold for 4 sec  Internal Anal Sphincter   External Anal Sphincter   Puborectalis   Diastasis Recti above and below umbilicus [id number] finger width, 1 knuckle depth  (Blank rows = not tested)        TONE: high  PROLAPSE: Mild not even grad 1 of anterior wall  TODAY'S TREATMENT:  DATE: 08/13/22     PATIENT EDUCATION:  Education details: Access Code: [email or web address] Person educated: Patient Education method: Explanation Education comprehension: verbalized understanding and needs further education  HOME EXERCISE PROGRAM: Access Code: [email or web address] URL: [email or web address] Date: 08/13/2022 Prepared by: Jari Favre  Exercises - Weight shift quadruped  - 1 x daily - 7 x weekly - 3 sets - 10 reps - Seated Hip Adduction Squeeze with Ball  - 1 x daily - 7 x weekly - 3 sets - 10  reps  ASSESSMENT:  CLINICAL IMPRESSION: Patient is a 37 y.o. female who was seen today for physical therapy treatment for pelvic pain. Pt is [redacted] weeks gestation   Pt will benefit from skilled PT to address strength and coordination for improved function and pain management.  OBJECTIVE IMPAIRMENTS: Abnormal gait, decreased activity tolerance, decreased coordination, decreased endurance, decreased ROM, decreased strength, increased muscle spasms, impaired flexibility, impaired tone, postural dysfunction, and pain.   ACTIVITY LIMITATIONS: sitting, standing, continence, and toileting  PARTICIPATION LIMITATIONS: interpersonal relationship, community activity, and occupation  PERSONAL FACTORS: 1-2 comorbidities: [redacted] weeks gestation, 3 vaginal deliveries, leg length discrepancy  are also affecting patient's functional outcome.   REHAB POTENTIAL: Excellent  CLINICAL DECISION MAKING: Evolving/moderate complexity  EVALUATION COMPLEXITY: Moderate   GOALS: Goals reviewed with patient? Yes  SHORT TERM GOALS: Target date: 08/19/22  Ind with initial HEP Baseline: Goal status: IN PROGRESS    LONG TERM GOALS: Target date: 11/20/22  Ind with HEP for healthy exercise throughout pregnancy Baseline:  Goal status: INITIAL  2.  Pt understands and aware of active labor positions for preparation Baseline:  Goal status: INITIAL  3.  Pt will be able to functional actions such as cough and sneeze without leakage  Baseline:  Goal status: INITIAL  4.  Pt will have 1/3 or less score of Marinoff scale  Baseline: 3/3 Goal status: INITIAL  5.  Pt will report 50% reduction of pain due to improvements in posture, strength, and muscle length and maintain as advancing gestation Baseline:  Goal status: INITIAL    PLAN:  PT FREQUENCY: 1x/week  PT DURATION: 4 months  PLANNED INTERVENTIONS: Therapeutic exercises, Therapeutic activity, Neuromuscular re-education, Balance training, Gait training,  Patient/Family education, Self Care, Joint mobilization, Aquatic Therapy, Electrical stimulation, Cryotherapy, Moist heat, Taping, Biofeedback, Manual therapy, and Re-evaluation  PLAN FOR NEXT SESSION: heel lift info, soft tissue work internally and work on relax and bulge, stretches, core brace with exhale   [employer], PT 08/13/2022, 9:21 AM

## 2022-08-18 ENCOUNTER — Encounter: Payer: Medicaid Other | Admitting: Obstetrics and Gynecology

## 2022-08-24 NOTE — Therapy (Unsigned)
OUTPATIENT PHYSICAL THERAPY FEMALE PELVIC TREATMENT   Patient Name: Terri Manning MRN: [id number] DOB:November 27, 1985, 37 y.o., female Today's Date: 08/25/2022  END OF SESSION:  PT End of Session - 08/25/22 0812     Visit Number 3    Date for PT Re-Evaluation 11/20/22    Authorization Type [employer] - submitted auth today    PT Start Time 0809    PT Stop Time 0849    PT Time Calculation (min) 40 min    Activity Tolerance Patient tolerated treatment well    Behavior During Therapy [facility] for tasks assessed/performed               Past Medical History:  Diagnosis Date   Herpes simplex virus (HSV) infection    Past Surgical History:  Procedure Laterality Date   APPENDECTOMY     Patient Active Problem List   Diagnosis Date Noted   Supervision of other normal pregnancy, antepartum 07/01/2022   History of delivery of macrosomal infant 04/07/2021   Genital herpes affecting pregnancy, antepartum 04/07/2021    PCP: shea church, PA-C  REFERRING PROVIDER: Wende Mott, CNM   REFERRING DIAG:  Z34.80 (ICD-10-CM) - Supervision of other normal pregnancy, antepartum  Z3A.12 (ICD-10-CM) - [redacted] weeks gestation of pregnancy    THERAPY DIAG:  Other muscle spasm  Muscle weakness (generalized)  Abnormal posture  Unspecified lack of coordination  Rationale for Evaluation and Treatment: Rehabilitation  ONSET DATE: current episode started a couple weeks ago  SUBJECTIVE:                                                                                                                                                                                           SUBJECTIVE STATEMENT: I am having pain with varicose veins.  When I first wake up and go to the bathroom everything feels really tight, but no pain right now after moving a little.  This weekend I was on my feet a lot and then after that there was pain.   Fluid intake: Yes: lots of water    PAIN:  Are you having pain?  No PRECAUTIONS: None  WEIGHT BEARING RESTRICTIONS: No  FALLS:  Has patient fallen in last 6 months? No  LIVING ENVIRONMENT: Lives with: lives with their family Lives in: House/apartment   OCCUPATION: accounting  PLOF: Independent  PATIENT GOALS: not have pain exercise and have healthy pregnancy  PERTINENT HISTORY:  3 vaginal deliveries Sexual abuse: No  BOWEL MOVEMENT: Pain with bowel movement: Yes sometimes Type of bowel movement:Type (Bristol Stool Scale) 4, Frequency normal, and Strain No Fully empty rectum: Yes:   Leakage:  No Pads: No Fiber supplement: No  URINATION: Pain with urination: No Fully empty bladder: Yes:   Stream: Strong Urgency: No Frequency: normal Leakage: Coughing and Sneezing Pads: No  INTERCOURSE: Pain with intercourse: During Penetration Ability to have vaginal penetration:  No Climax: too uncomfortable Marinoff Scale: 3/3  PREGNANCY: Vaginal deliveries 3 Tearing Yes: 2 stitches after the first C-section deliveries 0 Currently pregnant Yes: 13 weeks  PROLAPSE: None   OBJECTIVE:   DIAGNOSTIC FINDINGS:    PATIENT SURVEYS:    PFIQ-7 = 29  COGNITION: Overall cognitive status: Within functional limits for tasks assessed     SENSATION: Light touch:  Proprioception:   MUSCLE LENGTH: Hamstrings: Right 90 deg; Left 90 deg Thomas test:   LUMBAR SPECIAL TESTS:  ASLR twinge of pain with compression and Lt leg with back support  FUNCTIONAL TESTS:  Single leg stand normal  GAIT:  Comments: hip drop on left stance phase  POSTURE: left pelvic obliquity due to Lt LLD  PELVIC ALIGNMENT:normal in supine  LUMBARAROM/PROM:  A/PROM A/PROM  eval  Flexion WFL   Extension   Right lateral flexion   Left lateral flexion   Right rotation   Left rotation    (Blank rows = not tested)  LOWER EXTREMITY ROM:  Passive ROM Right eval Left eval  Hip flexion    Hip extension    Hip abduction    Hip adduction    Hip  internal rotation    Hip external rotation 75% 75%  Knee flexion    Knee extension    Ankle dorsiflexion    Ankle plantarflexion    Ankle inversion    Ankle eversion     (Blank rows = not tested)  LOWER EXTREMITY MMT: Rt leg 4-/5  MMT Right eval Left eval  Hip flexion 5 5  Hip extension 4-/5 5  Hip abduction 4-/5 4+/5  Hip adduction 4-/5 4+/5  Hip internal rotation 4/5 5  Hip external rotation 4/5 5  Knee flexion 5 5  Knee extension 5 5  Ankle dorsiflexion    Ankle plantarflexion    Ankle inversion    Ankle eversion     PALPATION:   General  tight lumbar left and gluteals tight                External Perineal Exam bilateral bulging and mild tenderness                             Internal Pelvic Floor high tone and tender levators bil and muscles feel shortened and thickened  Patient confirms identification and approves PT to assess internal pelvic floor and treatment Yes  PELVIC MMT:   MMT eval  Vaginal 4/5 x 5 reps; hold for 4 sec  Internal Anal Sphincter   External Anal Sphincter   Puborectalis   Diastasis Recti above and below umbilicus [id number] finger width, 1 knuckle depth  (Blank rows = not tested)        TONE: high  PROLAPSE: Mild not even grad 1 of anterior wall  TODAY'S TREATMENT:  DATE: 08/13/22  Manual: Patient confirms identification and approves physical therapist to perform internal soft tissue work  - bil levators, left obturator more tight, right levators more tight - more tenderness on left side Lumbar paraspinals in side lying  Exercise: Quadruped hip abduction with yellow loop Quadruped rocking side and forward with hip IR/ER Supine yellow loop abduction and marches Clam with ball press and yellow loop Side lying hip abduction   PATIENT EDUCATION:  Education details: Access Code: [email or web address] Person educated:  Patient Education method: Explanation Education comprehension: verbalized understanding and needs further education  HOME EXERCISE PROGRAM: Access Code: [email or web address] URL: [email or web address] Date: 08/13/2022 Prepared by: Jari Favre  Exercises - Weight shift quadruped  - 1 x daily - 7 x weekly - 3 sets - 10 reps - Seated Hip Adduction Squeeze with Ball  - 1 x daily - 7 x weekly - 3 sets - 10 reps  ASSESSMENT:  CLINICAL IMPRESSION: Today's session focused more on core and hip strengthening. Pt was able to add exercises to HEP to progress that.  Pt remains tight in obturator and levators but did have release with internal STM.  She also had improved since previous visit and was able to have intercourse with pain initially that resolved with breathing.  Overall pt shows improvement  Pt will benefit from skilled PT to address strength and coordination for improved function and pain management.  OBJECTIVE IMPAIRMENTS: Abnormal gait, decreased activity tolerance, decreased coordination, decreased endurance, decreased ROM, decreased strength, increased muscle spasms, impaired flexibility, impaired tone, postural dysfunction, and pain.   ACTIVITY LIMITATIONS: sitting, standing, continence, and toileting  PARTICIPATION LIMITATIONS: interpersonal relationship, community activity, and occupation  PERSONAL FACTORS: 1-2 comorbidities: [redacted] weeks gestation, 3 vaginal deliveries, leg length discrepancy  are also affecting patient's functional outcome.   REHAB POTENTIAL: Excellent  CLINICAL DECISION MAKING: Evolving/moderate complexity  EVALUATION COMPLEXITY: Moderate   GOALS: Goals reviewed with patient? Yes  SHORT TERM GOALS: Target date: 08/19/22  Ind with initial HEP Baseline: Goal status: IN PROGRESS    LONG TERM GOALS: Target date: 11/20/22  Ind with HEP for healthy exercise throughout pregnancy Baseline:  Goal status: IN PROGRESS  2.  Pt understands and  aware of active labor positions for preparation Baseline:  Goal status: IN PROGRESS  3.  Pt will be able to functional actions such as cough and sneeze without leakage  Baseline:  Goal status: IN PROGRESS  4.  Pt will have 1/3 or less score of Marinoff scale  Baseline: 3/3 Goal status: IN PROGRESS  5.  Pt will report 50% reduction of pain due to improvements in posture, strength, and muscle length and maintain as advancing gestation Baseline:  Goal status: IN PROGRESS    PLAN:  PT FREQUENCY: 1x/week  PT DURATION: 4 months  PLANNED INTERVENTIONS: Therapeutic exercises, Therapeutic activity, Neuromuscular re-education, Balance training, Gait training, Patient/Family education, Self Care, Joint mobilization, Aquatic Therapy, Electrical stimulation, Cryotherapy, Moist heat, Taping, Biofeedback, Manual therapy, and Re-evaluation  PLAN FOR NEXT SESSION: internal STM to obturator and levators, adductor, gluteal, piriformis stretches and progression of  core strength in different positions   [employer], PT 08/25/2022, 8:13 AM

## 2022-08-25 ENCOUNTER — Ambulatory Visit: Payer: Medicaid Other | Admitting: Physical Therapy

## 2022-08-25 DIAGNOSIS — M62838 Other muscle spasm: Secondary | ICD-10-CM | POA: Diagnosis not present

## 2022-08-25 DIAGNOSIS — M6281 Muscle weakness (generalized): Secondary | ICD-10-CM | POA: Insufficient documentation

## 2022-08-25 DIAGNOSIS — R293 Abnormal posture: Secondary | ICD-10-CM | POA: Insufficient documentation

## 2022-08-25 DIAGNOSIS — R269 Unspecified abnormalities of gait and mobility: Secondary | ICD-10-CM | POA: Diagnosis present

## 2022-08-25 DIAGNOSIS — R279 Unspecified lack of coordination: Secondary | ICD-10-CM | POA: Diagnosis present

## 2022-08-31 ENCOUNTER — Ambulatory Visit: Payer: Medicaid Other | Admitting: Physical Therapy

## 2022-08-31 NOTE — Therapy (Deleted)
OUTPATIENT PHYSICAL THERAPY FEMALE PELVIC TREATMENT   Patient Name: Terri Manning MRN: [id number] DOB:Sep 26, 1985, 37 y.o., female Today's Date: 08/31/2022  END OF SESSION:      Past Medical History:  Diagnosis Date   Herpes simplex virus (HSV) infection    Past Surgical History:  Procedure Laterality Date   APPENDECTOMY     Patient Active Problem List   Diagnosis Date Noted   Supervision of other normal pregnancy, antepartum 07/01/2022   History of delivery of macrosomal infant 04/07/2021   Genital herpes affecting pregnancy, antepartum 04/07/2021    PCP: shea church, PA-C  REFERRING PROVIDER: Wende Mott, CNM   REFERRING DIAG:  Z34.80 (ICD-10-CM) - Supervision of other normal pregnancy, antepartum  Z3A.12 (ICD-10-CM) - [redacted] weeks gestation of pregnancy    THERAPY DIAG:  No diagnosis found.  Rationale for Evaluation and Treatment: Rehabilitation  ONSET DATE: current episode started a couple weeks ago  SUBJECTIVE:                                                                                                                                                                                           SUBJECTIVE STATEMENT: I am having pain with varicose veins.  When I first wake up and go to the bathroom everything feels really tight, but no pain right now after moving a little.  This weekend I was on my feet a lot and then after that there was pain.   Fluid intake: Yes: lots of water    PAIN:  Are you having pain? No PRECAUTIONS: None  WEIGHT BEARING RESTRICTIONS: No  FALLS:  Has patient fallen in last 6 months? No  LIVING ENVIRONMENT: Lives with: lives with their family Lives in: House/apartment   OCCUPATION: accounting  PLOF: Independent  PATIENT GOALS: not have pain exercise and have healthy pregnancy  PERTINENT HISTORY:  3 vaginal deliveries Sexual abuse: No  BOWEL MOVEMENT: Pain with bowel movement: Yes sometimes Type of bowel  movement:Type (Bristol Stool Scale) 4, Frequency normal, and Strain No Fully empty rectum: Yes:   Leakage: No Pads: No Fiber supplement: No  URINATION: Pain with urination: No Fully empty bladder: Yes:   Stream: Strong Urgency: No Frequency: normal Leakage: Coughing and Sneezing Pads: No  INTERCOURSE: Pain with intercourse: During Penetration Ability to have vaginal penetration:  No Climax: too uncomfortable Marinoff Scale: 3/3  PREGNANCY: Vaginal deliveries 3 Tearing Yes: 2 stitches after the first C-section deliveries 0 Currently pregnant Yes: 13 weeks  PROLAPSE: None   OBJECTIVE:   DIAGNOSTIC FINDINGS:    PATIENT SURVEYS:    PFIQ-7 = 29  COGNITION: Overall cognitive status: Within  functional limits for tasks assessed     SENSATION: Light touch:  Proprioception:   MUSCLE LENGTH: Hamstrings: Right 90 deg; Left 90 deg Thomas test:   LUMBAR SPECIAL TESTS:  ASLR twinge of pain with compression and Lt leg with back support  FUNCTIONAL TESTS:  Single leg stand normal  GAIT:  Comments: hip drop on left stance phase  POSTURE: left pelvic obliquity due to Lt LLD  PELVIC ALIGNMENT:normal in supine  LUMBARAROM/PROM:  A/PROM A/PROM  eval  Flexion WFL   Extension   Right lateral flexion   Left lateral flexion   Right rotation   Left rotation    (Blank rows = not tested)  LOWER EXTREMITY ROM:  Passive ROM Right eval Left eval  Hip flexion    Hip extension    Hip abduction    Hip adduction    Hip internal rotation    Hip external rotation 75% 75%  Knee flexion    Knee extension    Ankle dorsiflexion    Ankle plantarflexion    Ankle inversion    Ankle eversion     (Blank rows = not tested)  LOWER EXTREMITY MMT: Rt leg 4-/5  MMT Right eval Left eval  Hip flexion 5 5  Hip extension 4-/5 5  Hip abduction 4-/5 4+/5  Hip adduction 4-/5 4+/5  Hip internal rotation 4/5 5  Hip external rotation 4/5 5  Knee flexion 5 5  Knee  extension 5 5  Ankle dorsiflexion    Ankle plantarflexion    Ankle inversion    Ankle eversion     PALPATION:   General  tight lumbar left and gluteals tight                External Perineal Exam bilateral bulging and mild tenderness                             Internal Pelvic Floor high tone and tender levators bil and muscles feel shortened and thickened  Patient confirms identification and approves PT to assess internal pelvic floor and treatment Yes  PELVIC MMT:   MMT eval  Vaginal 4/5 x 5 reps; hold for 4 sec  Internal Anal Sphincter   External Anal Sphincter   Puborectalis   Diastasis Recti above and below umbilicus [id number] finger width, 1 knuckle depth  (Blank rows = not tested)        TONE: high  PROLAPSE: Mild not even grad 1 of anterior wall  TODAY'S TREATMENT:                                                                                                                              DATE: 08/13/22  Manual: Patient confirms identification and approves physical therapist to perform internal soft tissue work  - bil levators, left obturator more tight, right levators more tight - more tenderness on left side Lumbar paraspinals in  side lying  Exercise: Quadruped hip abduction with yellow loop Quadruped rocking side and forward with hip IR/ER Supine yellow loop abduction and marches Clam with ball press and yellow loop Side lying hip abduction   PATIENT EDUCATION:  Education details: Access Code: [email or web address] Person educated: Patient Education method: Explanation Education comprehension: verbalized understanding and needs further education  HOME EXERCISE PROGRAM: Access Code: [email or web address] URL: [email or web address] Date: 08/13/2022 Prepared by: Jari Favre  Exercises - Weight shift quadruped  - 1 x daily - 7 x weekly - 3 sets - 10 reps - Seated Hip Adduction Squeeze with Ball  - 1 x daily - 7 x weekly - 3 sets - 10  reps  ASSESSMENT:  CLINICAL IMPRESSION: Today's session focused more on core and hip strengthening. Pt was able to add exercises to HEP to progress that.  Pt remains tight in obturator and levators but did have release with internal STM.  She also had improved since previous visit and was able to have intercourse with pain initially that resolved with breathing.  Overall pt shows improvement  Pt will benefit from skilled PT to address strength and coordination for improved function and pain management.  OBJECTIVE IMPAIRMENTS: Abnormal gait, decreased activity tolerance, decreased coordination, decreased endurance, decreased ROM, decreased strength, increased muscle spasms, impaired flexibility, impaired tone, postural dysfunction, and pain.   ACTIVITY LIMITATIONS: sitting, standing, continence, and toileting  PARTICIPATION LIMITATIONS: interpersonal relationship, community activity, and occupation  PERSONAL FACTORS: 1-2 comorbidities: [redacted] weeks gestation, 3 vaginal deliveries, leg length discrepancy  are also affecting patient's functional outcome.   REHAB POTENTIAL: Excellent  CLINICAL DECISION MAKING: Evolving/moderate complexity  EVALUATION COMPLEXITY: Moderate   GOALS: Goals reviewed with patient? Yes  SHORT TERM GOALS: Target date: 08/19/22  Ind with initial HEP Baseline: Goal status: IN PROGRESS    LONG TERM GOALS: Target date: 11/20/22  Ind with HEP for healthy exercise throughout pregnancy Baseline:  Goal status: IN PROGRESS  2.  Pt understands and aware of active labor positions for preparation Baseline:  Goal status: IN PROGRESS  3.  Pt will be able to functional actions such as cough and sneeze without leakage  Baseline:  Goal status: IN PROGRESS  4.  Pt will have 1/3 or less score of Marinoff scale  Baseline: 3/3 Goal status: IN PROGRESS  5.  Pt will report 50% reduction of pain due to improvements in posture, strength, and muscle length and maintain as  advancing gestation Baseline:  Goal status: IN PROGRESS    PLAN:  PT FREQUENCY: 1x/week  PT DURATION: 4 months  PLANNED INTERVENTIONS: Therapeutic exercises, Therapeutic activity, Neuromuscular re-education, Balance training, Gait training, Patient/Family education, Self Care, Joint mobilization, Aquatic Therapy, Electrical stimulation, Cryotherapy, Moist heat, Taping, Biofeedback, Manual therapy, and Re-evaluation  PLAN FOR NEXT SESSION: internal STM to obturator and levators, adductor, gluteal, piriformis stretches and progression of  core strength in different positions   [employer], PT 08/31/2022, 9:19 AM

## 2022-09-06 ENCOUNTER — Ambulatory Visit: Payer: Medicaid Other | Admitting: *Deleted

## 2022-09-06 ENCOUNTER — Ambulatory Visit: Payer: Medicaid Other | Attending: Obstetrics | Admitting: Maternal & Fetal Medicine

## 2022-09-06 ENCOUNTER — Ambulatory Visit: Payer: Medicaid Other | Attending: Advanced Practice Midwife

## 2022-09-06 ENCOUNTER — Encounter: Payer: Self-pay | Admitting: *Deleted

## 2022-09-06 VITALS — BP 109/60 | HR 78

## 2022-09-06 DIAGNOSIS — Z3A19 19 weeks gestation of pregnancy: Secondary | ICD-10-CM | POA: Diagnosis not present

## 2022-09-06 DIAGNOSIS — O3662X0 Maternal care for excessive fetal growth, second trimester, not applicable or unspecified: Secondary | ICD-10-CM | POA: Diagnosis not present

## 2022-09-06 DIAGNOSIS — Z3480 Encounter for supervision of other normal pregnancy, unspecified trimester: Secondary | ICD-10-CM | POA: Insufficient documentation

## 2022-09-06 DIAGNOSIS — O43192 Other malformation of placenta, second trimester: Secondary | ICD-10-CM

## 2022-09-06 DIAGNOSIS — O09512 Supervision of elderly primigravida, second trimester: Secondary | ICD-10-CM | POA: Diagnosis not present

## 2022-09-06 DIAGNOSIS — O321XX0 Maternal care for breech presentation, not applicable or unspecified: Secondary | ICD-10-CM | POA: Diagnosis not present

## 2022-09-06 DIAGNOSIS — O09292 Supervision of pregnancy with other poor reproductive or obstetric history, second trimester: Secondary | ICD-10-CM | POA: Diagnosis not present

## 2022-09-06 DIAGNOSIS — O43193 Other malformation of placenta, third trimester: Secondary | ICD-10-CM

## 2022-09-06 DIAGNOSIS — Z3A24 24 weeks gestation of pregnancy: Secondary | ICD-10-CM | POA: Insufficient documentation

## 2022-09-06 DIAGNOSIS — Z3689 Encounter for other specified antenatal screening: Secondary | ICD-10-CM

## 2022-09-06 DIAGNOSIS — Z363 Encounter for antenatal screening for malformations: Secondary | ICD-10-CM | POA: Insufficient documentation

## 2022-09-06 NOTE — Progress Notes (Unsigned)
Patient information  Patient Name: Terri Manning  Patient MRN:   [id number]  Referring practice: MFM Referring Provider: [place]  Medical/Obstetric History   Past pregnancies OB History  Gravida Para Term Preterm AB Living  5 3 3  0 1 3  SAB IAB Ectopic Multiple Live Births  1 0 0 0 3    # Outcome Date GA Lbr Len/2nd Weight Sex Delivery Anes PTL Lv  5 Current           4 Term 05/29/21 [redacted]w[redacted]d 03:19 / 00:14 7 lb (3.175 kg) M Vag-Spont None  LIV  3 Term 04/17/20 [redacted]w[redacted]d  8 lb (3.629 kg) M Vag-Spont EPI N LIV  2 Term 10/02/12 [redacted]w[redacted]d  10 lb (4.536 kg) M Vag-Spont EPI N LIV  1 SAB              Terri Manning is a 37 y.o. [id number] at [redacted]w[redacted]d here for ultrasound and consultation.   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 There are no other evident fetal or placental abnormalities and the placenta is well away from the internal os.  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  Normally the umbilical cord inserts centrally into the placenta, however, with a marginal cord insertion the umbilical cord inserts within less than 2 cm from the placental edge.  This is also known as a battledore placenta.  It occurs in about 6% of singleton pregnancies but is as high as 10 to 15% in twin pregnancies. Marginal cord insertion is even more common in monochorionic twins, and it has been associated with an increased risk for an SGA newborn in monochorionic but not dichorionic twin pregnancies. In singleton pregnancies, there are associations with adverse centric outcomes such as  placental abruption (odds ratio 1.5), placenta previa (odds ratio 1.8), and small-for-gestational age (SGA) neonate (odds ratio 1.2), but not perinatal deaths. I explained that because of the potential for fetal growth restriction, it is recommended that serial growth ultrasounds be performed during the pregnancy.  I also  discussed that the patient is advanced maternal age but she declines all genetic screening.  She understands that her risk of aneuploidy is based on her age.  The patient's risk of any chromosome abnormality is about 1 out of 84 and 1 out of 294 with trisomy 21.  This is probably even lower given her normal-appearing ultrasound.  She is comfortable with these results and declines further screening or diagnostic testing.  I discussed the utility of future ultrasounds to assess for fetal growth abnormalities given her advanced maternal age and marginal cord insertion.  She verbalized understanding but declines all future ultrasounds.  She is concerned about the potential theoretical risks of ultrasound during pregnancy.  I reassured her that the risks are minimal but the patient declined in favor of fundal height measurements.  Sonographic findings Single intrauterine pregnancy. Fetal cardiac activity:  Observed and appears normal. Presentation: Breech. The anatomic structures that were well seen appear normal without evidence of soft markers. Most structures were well visualized but some remain suboptimally seen.  Fetal biometry shows the estimated fetal weight consistent with dates.  Amniotic fluid volume: Within normal limits MVP: 5.01 cm. Placenta: Posterior.  Assessment Marginal cord insertion (MCI) AMA Plan Future ultrasounds were declined by the patient unless there is a fundal height abnormality.  Continue fundal height measurements at 24 weeks and beyond with referral back to  MFM if there is a discrepancy between the fundal height and the expected measurement.  Review of Systems: A review of systems was performed and was negative except per HPI   Vitals and Physical Exam    09/06/2022    9:26 AM 07/20/2022    9:14 AM 07/12/2022    8:39 AM  Vitals with BMI  Height  5\' 9"  5\' 9"   Weight  142 lbs 10 oz 140 lbs 11 oz  BMI  [id number]  Systolic 0000000 [id number]  Diastolic 60 74 50  Pulse  78 86 99    Sitting comfortably on the sonogram table Nonlabored breathing Normal rate and rhythm Abdomen is nontender  I spent 30 minutes reviewing the patients chart, including labs and images as well as counseling the patient about her medical conditions. Greater than 50% of the time was spent in direct face-to-face patient counseling.  Valeda Malm  MFM, [place]   09/06/2022  1:06 PM   Future Appointments   Future Appointments  Date Time Provider [place]  09/08/2022  4:15 PM Desenglau, Tommy Rainwater, PT [facility] None  09/14/2022  8:35 AM Leftwich-Kirby, Kathie Dike, CNM [facility] None  09/15/2022  4:15 PM Desenglau, Tommy Rainwater, PT [facility] None  09/22/2022  4:15 PM Desenglau, Tommy Rainwater, PT [facility] None  09/28/2022  4:15 PM Desenglau, Tommy Rainwater, PT [facility] None  10/04/2022  4:15 PM Desenglau, Tommy Rainwater, PT [facility] None  10/11/2022  4:15 PM Desenglau, Tommy Rainwater, PT [facility] None  10/19/2022  4:15 PM Desenglau, Tommy Rainwater, PT [facility] None  10/28/2022  4:15 PM Desenglau, Tommy Rainwater, PT [facility] None

## 2022-09-08 ENCOUNTER — Ambulatory Visit: Payer: Medicaid Other | Admitting: Physical Therapy

## 2022-09-08 ENCOUNTER — Encounter: Payer: Self-pay | Admitting: Physical Therapy

## 2022-09-08 ENCOUNTER — Encounter: Payer: Medicaid Other | Admitting: Obstetrics and Gynecology

## 2022-09-08 DIAGNOSIS — R293 Abnormal posture: Secondary | ICD-10-CM

## 2022-09-08 DIAGNOSIS — M62838 Other muscle spasm: Secondary | ICD-10-CM

## 2022-09-08 DIAGNOSIS — R279 Unspecified lack of coordination: Secondary | ICD-10-CM

## 2022-09-08 DIAGNOSIS — M6281 Muscle weakness (generalized): Secondary | ICD-10-CM

## 2022-09-08 NOTE — Therapy (Signed)
OUTPATIENT PHYSICAL THERAPY FEMALE PELVIC TREATMENT   Patient Name: Terri Manning MRN: [id number] DOB:13-Dec-1985, 37 y.o., female Today's Date: 09/08/2022  END OF SESSION:  PT End of Session - 09/08/22 1619     Visit Number 4    Number of Visits 7    Date for PT Re-Evaluation 11/20/22    Authorization Type [employer] - Approved 6 visits, 07/26/2022-09/23/2022    PT Start Time [id number]    PT Stop Time 1657    PT Time Calculation (min) 40 min    Activity Tolerance Patient tolerated treatment well    Behavior During Therapy WFL for tasks assessed/performed                Past Medical History:  Diagnosis Date   Herpes simplex virus (HSV) infection    Past Surgical History:  Procedure Laterality Date   APPENDECTOMY     Patient Active Problem List   Diagnosis Date Noted   Supervision of other normal pregnancy, antepartum 07/01/2022   History of delivery of macrosomal infant 04/07/2021   Genital herpes affecting pregnancy, antepartum 04/07/2021    PCP: shea church, PA-C  REFERRING PROVIDER: Wende Mott, CNM   REFERRING DIAG:  Z34.80 (ICD-10-CM) - Supervision of other normal pregnancy, antepartum  Z3A.12 (ICD-10-CM) - [redacted] weeks gestation of pregnancy    THERAPY DIAG:  Other muscle spasm  Muscle weakness (generalized)  Abnormal posture  Unspecified lack of coordination  Rationale for Evaluation and Treatment: Rehabilitation  ONSET DATE: current episode started a couple weeks ago  SUBJECTIVE:                                                                                                                                                                                           SUBJECTIVE STATEMENT: I am having more pain with varicose veins.  I ordered another belt because I got rid of the other one.  Doing exercises.  Pt was sick with pneumonia.  The inguinal lymph nodes are swollen.  My sacrum is locked up at the end of the day.   The leakage is worse when  coughing lately and even after I just used the bathroom.  Fluid intake: Yes: lots of water    PAIN:  Are you having pain? No PRECAUTIONS: None  WEIGHT BEARING RESTRICTIONS: No  FALLS:  Has patient fallen in last 6 months? No  LIVING ENVIRONMENT: Lives with: lives with their family Lives in: House/apartment   OCCUPATION: accounting  PLOF: Independent  PATIENT GOALS: not have pain exercise and have healthy pregnancy  PERTINENT HISTORY:  3 vaginal deliveries Sexual abuse: No  BOWEL MOVEMENT:  Pain with bowel movement: Yes sometimes Type of bowel movement:Type (Bristol Stool Scale) 4, Frequency normal, and Strain No Fully empty rectum: Yes:   Leakage: No Pads: No Fiber supplement: No  URINATION: Pain with urination: No Fully empty bladder: Yes:   Stream: Strong Urgency: No Frequency: normal Leakage: Coughing and Sneezing Pads: No  INTERCOURSE: Pain with intercourse: During Penetration Ability to have vaginal penetration:  No Climax: too uncomfortable Marinoff Scale: 3/3  PREGNANCY: Vaginal deliveries 3 Tearing Yes: 2 stitches after the first C-section deliveries 0 Currently pregnant Yes: 13 weeks  PROLAPSE: None   OBJECTIVE:   DIAGNOSTIC FINDINGS:    PATIENT SURVEYS:    PFIQ-7 = 29  COGNITION: Overall cognitive status: Within functional limits for tasks assessed     SENSATION: Light touch:  Proprioception:   MUSCLE LENGTH: Hamstrings: Right 90 deg; Left 90 deg Thomas test:   LUMBAR SPECIAL TESTS:  ASLR twinge of pain with compression and Lt leg with back support  FUNCTIONAL TESTS:  Single leg stand normal  GAIT:  Comments: hip drop on left stance phase  POSTURE: left pelvic obliquity due to Lt LLD  PELVIC ALIGNMENT:normal in supine  LUMBARAROM/PROM:  A/PROM A/PROM  eval  Flexion WFL   Extension   Right lateral flexion   Left lateral flexion   Right rotation   Left rotation    (Blank rows = not tested)  LOWER  EXTREMITY ROM:  Passive ROM Right eval Left eval  Hip flexion    Hip extension    Hip abduction    Hip adduction    Hip internal rotation    Hip external rotation 75% 75%  Knee flexion    Knee extension    Ankle dorsiflexion    Ankle plantarflexion    Ankle inversion    Ankle eversion     (Blank rows = not tested)  LOWER EXTREMITY MMT: Rt leg 4-/5  MMT Right eval Left eval  Hip flexion 5 5  Hip extension 4-/5 5  Hip abduction 4-/5 4+/5  Hip adduction 4-/5 4+/5  Hip internal rotation 4/5 5  Hip external rotation 4/5 5  Knee flexion 5 5  Knee extension 5 5  Ankle dorsiflexion    Ankle plantarflexion    Ankle inversion    Ankle eversion     PALPATION:   General  tight lumbar left and gluteals tight                External Perineal Exam bilateral bulging and mild tenderness                             Internal Pelvic Floor high tone and tender levators bil and muscles feel shortened and thickened  Patient confirms identification and approves PT to assess internal pelvic floor and treatment Yes  PELVIC MMT:   MMT eval  Vaginal 4/5 x 5 reps; hold for 4 sec  Internal Anal Sphincter   External Anal Sphincter   Puborectalis   Diastasis Recti above and below umbilicus [id number] finger width, 1 knuckle depth  (Blank rows = not tested)        TONE: high  PROLAPSE: Mild not even grad 1 of anterior wall  TODAY'S TREATMENT:  DATE: 09/08/22  Manual: Patient confirms identification and approves physical therapist to perform internal soft tissue work  - bil levators, left obturator more tight, right levators more tight - more tenderness on left side NMRE: Breathing and bulging and educated on knack with tactile cues, discussed breathing and relaxing after coughing to relax pelvic floor    PATIENT EDUCATION:  Education details: Access Code:  [email or web address] Person educated: Patient Education method: Explanation Education comprehension: verbalized understanding and needs further education  HOME EXERCISE PROGRAM: Access Code: [email or web address] URL: [email or web address] Date: 08/13/2022 Prepared by: Jari Favre  Exercises - Weight shift quadruped  - 1 x daily - 7 x weekly - 3 sets - 10 reps - Seated Hip Adduction Squeeze with Ball  - 1 x daily - 7 x weekly - 3 sets - 10 reps  ASSESSMENT:  CLINICAL IMPRESSION: Today's session focused on soft tissue release as pt was having more tension since she had worsening of cough.  Pt was educated on and perform lymph massage of LE.  Pt responded well to internal STM and had improved soft tissue length. Educated in [place] and to relax after coughing to reduce increased tone.  Pt will benefit from skilled PT to address strength and coordination for improved function and pain management.  OBJECTIVE IMPAIRMENTS: Abnormal gait, decreased activity tolerance, decreased coordination, decreased endurance, decreased ROM, decreased strength, increased muscle spasms, impaired flexibility, impaired tone, postural dysfunction, and pain.   ACTIVITY LIMITATIONS: sitting, standing, continence, and toileting  PARTICIPATION LIMITATIONS: interpersonal relationship, community activity, and occupation  PERSONAL FACTORS: 1-2 comorbidities: [redacted] weeks gestation, 3 vaginal deliveries, leg length discrepancy  are also affecting patient's functional outcome.   REHAB POTENTIAL: Excellent  CLINICAL DECISION MAKING: Evolving/moderate complexity  EVALUATION COMPLEXITY: Moderate   GOALS: Goals reviewed with patient? Yes  SHORT TERM GOALS: Target date: 08/19/22  Ind with initial HEP Baseline: Goal status: IN PROGRESS    LONG TERM GOALS: Target date: 11/20/22  Ind with HEP for healthy exercise throughout pregnancy Baseline:  Goal status: IN PROGRESS  2.  Pt understands and aware of active labor  positions for preparation Baseline:  Goal status: IN PROGRESS  3.  Pt will be able to functional actions such as cough and sneeze without leakage  Baseline:  Goal status: IN PROGRESS  4.  Pt will have 1/3 or less score of Marinoff scale  Baseline: that has not been painful Goal status: MET  5.  Pt will report 50% reduction of pain due to improvements in posture, strength, and muscle length and maintain as advancing gestation Baseline: the bowel movements have not been painful Goal status: IN PROGRESS    PLAN:  PT FREQUENCY: 1x/week  PT DURATION: 4 months  PLANNED INTERVENTIONS: Therapeutic exercises, Therapeutic activity, Neuromuscular re-education, Balance training, Gait training, Patient/Family education, Self Care, Joint mobilization, Aquatic Therapy, Electrical stimulation, Cryotherapy, Moist heat, Taping, Biofeedback, Manual therapy, and Re-evaluation  PLAN FOR NEXT SESSION: discuss compression for varicose veins; internal STM to obturator and levators, adductor, gluteal, piriformis stretches and progression of  core strength in different positions   [employer], PT 09/08/2022, 5:04 PM

## 2022-09-12 ENCOUNTER — Encounter (HOSPITAL_BASED_OUTPATIENT_CLINIC_OR_DEPARTMENT_OTHER): Payer: Self-pay | Admitting: Advanced Practice Midwife

## 2022-09-13 NOTE — Progress Notes (Unsigned)
   PRENATAL VISIT NOTE  Subjective:  Terri Manning is a 37 y.o. [phone] at [redacted]w[redacted]d being seen today for ongoing prenatal care.  She is currently monitored for the following issues for this {Blank single:19197::"high-risk","low-risk"} pregnancy and has History of delivery of macrosomal infant; Genital herpes affecting pregnancy, antepartum; Marginal insertion of umbilical cord affecting management of mother; and Supervision of other normal pregnancy, antepartum on their problem list.  Patient reports {[id number]}.   .  .   . Denies leaking of fluid.   The following portions of the patient's history were reviewed and updated as appropriate: allergies, current medications, past family history, past medical history, past social history, past surgical history and problem list.   Objective:  There were no vitals filed for this visit.  Fetal Status:           General:  Alert, oriented and cooperative. Patient is in no acute distress.  Skin: Skin is warm and dry. No rash noted.   Cardiovascular: Normal heart rate noted  Respiratory: Normal respiratory effort, no problems with respiration noted  Abdomen: Soft, gravid, appropriate for gestational age.        Pelvic: {Blank single:19197::"Cervical exam performed in the presence of a chaperone","Cervical exam deferred"}        Extremities: Normal range of motion.     Mental Status: Normal mood and affect. Normal behavior. Normal judgment and thought content.   Assessment and Plan:  Pregnancy: [id number] at [redacted]w[redacted]d 1. Supervision of other normal pregnancy, antepartum ***  2. [redacted] weeks gestation of pregnancy ***  {Blank single:19197::"Term","Preterm"} labor symptoms and general obstetric precautions including but not limited to vaginal bleeding, contractions, leaking of fluid and fetal movement were reviewed in detail with the patient. Please refer to After Visit Summary for other counseling recommendations.   No follow-ups on file.  Future  Appointments  Date Time Provider [place]  09/14/2022  8:35 AM Leftwich-Kirby, Kathie Dike, CNM [facility] None  09/15/2022  4:15 PM Desenglau, Tommy Rainwater, PT [facility] None  09/22/2022  4:15 PM Desenglau, Tommy Rainwater, PT [facility] None  09/28/2022  4:15 PM Desenglau, Tommy Rainwater, PT [facility] None  10/04/2022  4:15 PM Desenglau, Tommy Rainwater, PT [facility] None  10/11/2022  4:15 PM Desenglau, Tommy Rainwater, PT [facility] None  10/19/2022  4:15 PM Desenglau, Tommy Rainwater, PT [facility] None  10/28/2022  4:15 PM Desenglau, Tommy Rainwater, PT [facility] None    Fatima Blank, CNM

## 2022-09-14 ENCOUNTER — Ambulatory Visit (INDEPENDENT_AMBULATORY_CARE_PROVIDER_SITE_OTHER): Payer: Medicaid Other | Admitting: Advanced Practice Midwife

## 2022-09-14 ENCOUNTER — Encounter: Payer: Self-pay | Admitting: Advanced Practice Midwife

## 2022-09-14 VITALS — BP 106/63 | HR 85 | Wt 156.2 lb

## 2022-09-14 DIAGNOSIS — Z3480 Encounter for supervision of other normal pregnancy, unspecified trimester: Secondary | ICD-10-CM

## 2022-09-14 DIAGNOSIS — Z3482 Encounter for supervision of other normal pregnancy, second trimester: Secondary | ICD-10-CM

## 2022-09-14 DIAGNOSIS — Z3A20 20 weeks gestation of pregnancy: Secondary | ICD-10-CM

## 2022-09-14 DIAGNOSIS — I8393 Asymptomatic varicose veins of bilateral lower extremities: Secondary | ICD-10-CM

## 2022-09-14 NOTE — Progress Notes (Signed)
Pt presents for ROB visit. Declines AFP testing. Pt reports painful varicose veins, requesting suggestions.

## 2022-09-15 ENCOUNTER — Ambulatory Visit: Payer: Medicaid Other | Admitting: Physical Therapy

## 2022-09-15 ENCOUNTER — Encounter: Payer: Self-pay | Admitting: Physical Therapy

## 2022-09-15 ENCOUNTER — Encounter: Payer: Medicaid Other | Admitting: Obstetrics and Gynecology

## 2022-09-15 DIAGNOSIS — M62838 Other muscle spasm: Secondary | ICD-10-CM | POA: Diagnosis not present

## 2022-09-15 DIAGNOSIS — M6281 Muscle weakness (generalized): Secondary | ICD-10-CM

## 2022-09-15 DIAGNOSIS — R293 Abnormal posture: Secondary | ICD-10-CM

## 2022-09-15 DIAGNOSIS — R269 Unspecified abnormalities of gait and mobility: Secondary | ICD-10-CM

## 2022-09-15 DIAGNOSIS — R279 Unspecified lack of coordination: Secondary | ICD-10-CM

## 2022-09-15 NOTE — Therapy (Signed)
OUTPATIENT PHYSICAL THERAPY FEMALE PELVIC TREATMENT   Patient Name: Terri Manning MRN: [id number] DOB:1985-08-14, 37 y.o., female Today's Date: 09/15/2022  END OF SESSION:  PT End of Session - 09/15/22 1622     Visit Number 5    Number of Visits 7    Date for PT Re-Evaluation 11/20/22    Authorization Type [employer] - Approved 6 visits, 07/26/2022-09/23/2022    PT Start Time 1619    PT Stop Time 1700    PT Time Calculation (min) 41 min    Activity Tolerance Patient tolerated treatment well    Behavior During Therapy WFL for tasks assessed/performed                 Past Medical History:  Diagnosis Date   Herpes simplex virus (HSV) infection    Past Surgical History:  Procedure Laterality Date   APPENDECTOMY     Patient Active Problem List   Diagnosis Date Noted   Supervision of other normal pregnancy, antepartum 07/01/2022   Marginal insertion of umbilical cord affecting management of mother 04/23/2021   History of delivery of macrosomal infant 04/07/2021   Genital herpes affecting pregnancy, antepartum 04/07/2021    PCP: shea church, PA-C  REFERRING PROVIDER: Wende Mott, CNM   REFERRING DIAG:  Z34.80 (ICD-10-CM) - Supervision of other normal pregnancy, antepartum  Z3A.12 (ICD-10-CM) - [redacted] weeks gestation of pregnancy    THERAPY DIAG:  Other muscle spasm  Muscle weakness (generalized)  Abnormal posture  Unspecified lack of coordination  Abnormality of gait and mobility  Rationale for Evaluation and Treatment: Rehabilitation  ONSET DATE: current episode started a couple weeks ago  SUBJECTIVE:                                                                                                                                                                                           SUBJECTIVE STATEMENT: I am not having as much pain and got some compression tight that go all the way up and that helped the varicose veins.  Pt having less pelvic  pain and less leakage with bracing before coughing.  Fluid intake: Yes: lots of water    PAIN:  Are you having pain? No PRECAUTIONS: None  WEIGHT BEARING RESTRICTIONS: No  FALLS:  Has patient fallen in last 6 months? No  LIVING ENVIRONMENT: Lives with: lives with their family Lives in: House/apartment   OCCUPATION: accounting  PLOF: Independent  PATIENT GOALS: not have pain exercise and have healthy pregnancy  PERTINENT HISTORY:  3 vaginal deliveries Sexual abuse: No  BOWEL MOVEMENT: Pain with bowel movement: Yes sometimes Type of bowel movement:Type (  Bristol Stool Scale) 4, Frequency normal, and Strain No Fully empty rectum: Yes:   Leakage: No Pads: No Fiber supplement: No  URINATION: Pain with urination: No Fully empty bladder: Yes:   Stream: Strong Urgency: No Frequency: normal Leakage: Coughing and Sneezing Pads: No  INTERCOURSE: Pain with intercourse: During Penetration Ability to have vaginal penetration:  No Climax: too uncomfortable Marinoff Scale: 3/3  PREGNANCY: Vaginal deliveries 3 Tearing Yes: 2 stitches after the first C-section deliveries 0 Currently pregnant Yes: 13 weeks  PROLAPSE: None   OBJECTIVE:   DIAGNOSTIC FINDINGS:    PATIENT SURVEYS:    PFIQ-7 = 29  COGNITION: Overall cognitive status: Within functional limits for tasks assessed     SENSATION: Light touch:  Proprioception:   MUSCLE LENGTH: Hamstrings: Right 90 deg; Left 90 deg Thomas test:   LUMBAR SPECIAL TESTS:  ASLR twinge of pain with compression and Lt leg with back support  FUNCTIONAL TESTS:  Single leg stand normal  GAIT:  Comments: hip drop on left stance phase  POSTURE: left pelvic obliquity due to Lt LLD  PELVIC ALIGNMENT:normal in supine  LUMBARAROM/PROM:  A/PROM A/PROM  eval  Flexion WFL   Extension   Right lateral flexion   Left lateral flexion   Right rotation   Left rotation    (Blank rows = not tested)  LOWER EXTREMITY  ROM:  Passive ROM Right eval Left eval  Hip flexion    Hip extension    Hip abduction    Hip adduction    Hip internal rotation    Hip external rotation 75% 75%  Knee flexion    Knee extension    Ankle dorsiflexion    Ankle plantarflexion    Ankle inversion    Ankle eversion     (Blank rows = not tested)  LOWER EXTREMITY MMT: Rt leg 4-/5  MMT Right eval Left eval  Hip flexion 5 5  Hip extension 4-/5 5  Hip abduction 4-/5 4+/5  Hip adduction 4-/5 4+/5  Hip internal rotation 4/5 5  Hip external rotation 4/5 5  Knee flexion 5 5  Knee extension 5 5  Ankle dorsiflexion    Ankle plantarflexion    Ankle inversion    Ankle eversion     PALPATION:   General  tight lumbar left and gluteals tight                External Perineal Exam bilateral bulging and mild tenderness                             Internal Pelvic Floor high tone and tender levators bil and muscles feel shortened and thickened  Patient confirms identification and approves PT to assess internal pelvic floor and treatment Yes  PELVIC MMT:   MMT eval  Vaginal 4/5 x 5 reps; hold for 4 sec  Internal Anal Sphincter   External Anal Sphincter   Puborectalis   Diastasis Recti above and below umbilicus [id number] finger width, 1 knuckle depth  (Blank rows = not tested)        TONE: high  PROLAPSE: Mild not even grad 1 of anterior wall  TODAY'S TREATMENT:  DATE: 09/15/22  Manual: Patient confirms identification and approves physical therapist to perform internal soft tissue work  - bil levators, left obturator more tight, right levators more tight - more tenderness on left side  Upright and bent over row, punch - 3lb in each hand- 10x each side Vibration plate - massage setting for lymph - h/s stretch and fwd lunges - 60 sec bil Side lying hip abduction 10x bil Mini squat kegel -  2x10 Stationary lunge 10x bil Lying on half foam roll - opposite UE/LE, UE overhead with 2lb and ball squeeze      PATIENT EDUCATION:  Education details: Access Code: [email or web address] Person educated: Patient Education method: Explanation Education comprehension: verbalized understanding and needs further education  HOME EXERCISE PROGRAM: Access Code: [email or web address] URL: [email or web address] Date: 08/13/2022 Prepared by: Jari Favre  Exercises - Weight shift quadruped  - 1 x daily - 7 x weekly - 3 sets - 10 reps - Seated Hip Adduction Squeeze with Ball  - 1 x daily - 7 x weekly - 3 sets - 10 reps  ASSESSMENT:  CLINICAL IMPRESSION: Today's session focused on core strengthening as patient was having much less pain today.  Pt was able to add more dynamic and functional exercise.  Pt had no pain and felt core engaged during all exericses  Pt will benefit from skilled PT to address strength and coordination for improved function and pain management.  OBJECTIVE IMPAIRMENTS: Abnormal gait, decreased activity tolerance, decreased coordination, decreased endurance, decreased ROM, decreased strength, increased muscle spasms, impaired flexibility, impaired tone, postural dysfunction, and pain.   ACTIVITY LIMITATIONS: sitting, standing, continence, and toileting  PARTICIPATION LIMITATIONS: interpersonal relationship, community activity, and occupation  PERSONAL FACTORS: 1-2 comorbidities: [redacted] weeks gestation, 3 vaginal deliveries, leg length discrepancy  are also affecting patient's functional outcome.   REHAB POTENTIAL: Excellent  CLINICAL DECISION MAKING: Evolving/moderate complexity  EVALUATION COMPLEXITY: Moderate   GOALS: Goals reviewed with patient? Yes  SHORT TERM GOALS: Target date: 08/19/22  Ind with initial HEP Baseline: Goal status: IN PROGRESS    LONG TERM GOALS: Target date: 11/20/22  Ind with HEP for healthy exercise throughout pregnancy Baseline:   Goal status: IN PROGRESS  2.  Pt understands and aware of active labor positions for preparation Baseline:  Goal status: IN PROGRESS  3.  Pt will be able to functional actions such as cough and sneeze without leakage  Baseline: the coughing is getting better and leakage is better Goal status: IN PROGRESS  4.  Pt will have 1/3 or less score of Marinoff scale  Baseline: that has not been painful Goal status: MET  5.  Pt will report 50% reduction of pain due to improvements in posture, strength, and muscle length and maintain as advancing gestation Baseline: 70% better this past Goal status: IN PROGRESS    PLAN:  PT FREQUENCY: 1x/week  PT DURATION: 4 months  PLANNED INTERVENTIONS: Therapeutic exercises, Therapeutic activity, Neuromuscular re-education, Balance training, Gait training, Patient/Family education, Self Care, Joint mobilization, Aquatic Therapy, Electrical stimulation, Cryotherapy, Moist heat, Taping, Biofeedback, Manual therapy, and Re-evaluation  PLAN FOR NEXT SESSION: re-assess pelvic floor and work on Ambulance person, give handout for labor positions   [employer], PT 09/15/2022, 4:34 PM

## 2022-09-18 ENCOUNTER — Encounter: Payer: Self-pay | Admitting: Advanced Practice Midwife

## 2022-09-22 ENCOUNTER — Ambulatory Visit: Payer: Medicaid Other | Admitting: Physical Therapy

## 2022-09-22 ENCOUNTER — Encounter: Payer: Self-pay | Admitting: Physical Therapy

## 2022-09-22 DIAGNOSIS — R279 Unspecified lack of coordination: Secondary | ICD-10-CM

## 2022-09-22 DIAGNOSIS — M6281 Muscle weakness (generalized): Secondary | ICD-10-CM | POA: Insufficient documentation

## 2022-09-22 DIAGNOSIS — M62838 Other muscle spasm: Secondary | ICD-10-CM | POA: Diagnosis not present

## 2022-09-22 DIAGNOSIS — R293 Abnormal posture: Secondary | ICD-10-CM | POA: Diagnosis present

## 2022-09-22 NOTE — Therapy (Signed)
OUTPATIENT PHYSICAL THERAPY FEMALE PELVIC TREATMENT   Patient Name: Terri Manning MRN: [id number] DOB:30-May-1986, 37 y.o., female Today's Date: 09/22/2022  END OF SESSION:  PT End of Session - 09/22/22 1625     Visit Number 6    Number of Visits 7    Date for PT Re-Evaluation 11/20/22    Authorization Type [employer] - Approved 6 visits, 07/26/2022-09/23/2022    PT Start Time 1619    PT Stop Time 1702    PT Time Calculation (min) 43 min    Activity Tolerance Patient tolerated treatment well    Behavior During Therapy WFL for tasks assessed/performed                  Past Medical History:  Diagnosis Date   Herpes simplex virus (HSV) infection    Past Surgical History:  Procedure Laterality Date   APPENDECTOMY     Patient Active Problem List   Diagnosis Date Noted   Supervision of other normal pregnancy, antepartum 07/01/2022   Marginal insertion of umbilical cord affecting management of mother 04/23/2021   History of delivery of macrosomal infant 04/07/2021   Genital herpes affecting pregnancy, antepartum 04/07/2021    PCP: shea church, PA-C  REFERRING PROVIDER: Wende Mott, CNM   REFERRING DIAG:  Z34.80 (ICD-10-CM) - Supervision of other normal pregnancy, antepartum  Z3A.12 (ICD-10-CM) - [redacted] weeks gestation of pregnancy    THERAPY DIAG:  Other muscle spasm  Muscle weakness (generalized)  Abnormal posture  Unspecified lack of coordination  Rationale for Evaluation and Treatment: Rehabilitation  ONSET DATE: current episode started a couple weeks ago  SUBJECTIVE:                                                                                                                                                                                           SUBJECTIVE STATEMENT: I am still feeling better and no leakage.  Just feeling pressure on my stomach sphincter and think that is what is causing the coughing at night  Fluid intake: Yes: lots of water     PAIN:  Are you having pain? No PRECAUTIONS: None  WEIGHT BEARING RESTRICTIONS: No  FALLS:  Has patient fallen in last 6 months? No  LIVING ENVIRONMENT: Lives with: lives with their family Lives in: House/apartment   OCCUPATION: accounting  PLOF: Independent  PATIENT GOALS: not have pain exercise and have healthy pregnancy  PERTINENT HISTORY:  3 vaginal deliveries Sexual abuse: No  BOWEL MOVEMENT: Pain with bowel movement: Yes sometimes Type of bowel movement:Type (Bristol Stool Scale) 4, Frequency normal, and Strain No Fully empty rectum: Yes:  Leakage: No Pads: No Fiber supplement: No  URINATION: Pain with urination: No Fully empty bladder: Yes:   Stream: Strong Urgency: No Frequency: normal Leakage: Coughing and Sneezing Pads: No  INTERCOURSE: Pain with intercourse: During Penetration Ability to have vaginal penetration:  No Climax: too uncomfortable Marinoff Scale: 3/3  PREGNANCY: Vaginal deliveries 3 Tearing Yes: 2 stitches after the first C-section deliveries 0 Currently pregnant Yes: 13 weeks  PROLAPSE: None   OBJECTIVE:   DIAGNOSTIC FINDINGS:    PATIENT SURVEYS:    PFIQ-7 = 29  COGNITION: Overall cognitive status: Within functional limits for tasks assessed     SENSATION: Light touch:  Proprioception:   MUSCLE LENGTH: Hamstrings: Right 90 deg; Left 90 deg Thomas test:   LUMBAR SPECIAL TESTS:  ASLR twinge of pain with compression and Lt leg with back support  FUNCTIONAL TESTS:  Single leg stand normal  GAIT:  Comments: hip drop on left stance phase  POSTURE: left pelvic obliquity due to Lt LLD  PELVIC ALIGNMENT:normal in supine  LUMBARAROM/PROM:  A/PROM A/PROM  eval  Flexion WFL   Extension   Right lateral flexion   Left lateral flexion   Right rotation   Left rotation    (Blank rows = not tested)  LOWER EXTREMITY ROM:  Passive ROM Right eval Left eval  Hip flexion    Hip extension    Hip  abduction    Hip adduction    Hip internal rotation    Hip external rotation 75% 75%  Knee flexion    Knee extension    Ankle dorsiflexion    Ankle plantarflexion    Ankle inversion    Ankle eversion     (Blank rows = not tested)  LOWER EXTREMITY MMT: Rt leg 4-/5  MMT Right eval Left eval  Hip flexion 5 5  Hip extension 4-/5 5  Hip abduction 4-/5 4+/5  Hip adduction 4-/5 4+/5  Hip internal rotation 4/5 5  Hip external rotation 4/5 5  Knee flexion 5 5  Knee extension 5 5  Ankle dorsiflexion    Ankle plantarflexion    Ankle inversion    Ankle eversion     PALPATION:   General  tight lumbar left and gluteals tight                External Perineal Exam bilateral bulging and mild tenderness                             Internal Pelvic Floor high tone and tender levators bil and muscles feel shortened and thickened  Patient confirms identification and approves PT to assess internal pelvic floor and treatment Yes  PELVIC MMT:   MMT eval  Vaginal 4/5 x 5 reps; hold for 4 sec  Internal Anal Sphincter   External Anal Sphincter   Puborectalis   Diastasis Recti above and below umbilicus [id number] finger width, 1 knuckle depth  (Blank rows = not tested)        TONE: high  PROLAPSE: Mild not even grad 1 of anterior wall  TODAY'S TREATMENT:  DATE: 09/15/22  Manual: Abdominal fascial release and lumbar stretching  Reviewed active labor positions     PATIENT EDUCATION:  Education details: Access Code: [email or web address] Person educated: Patient Education method: Explanation Education comprehension: verbalized understanding and needs further education  HOME EXERCISE PROGRAM: Access Code: [email or web address] URL: [email or web address] Date: 08/13/2022 Prepared by: Jari Favre  Exercises - Weight shift quadruped  - 1 x daily - 7 x weekly -  3 sets - 10 reps - Seated Hip Adduction Squeeze with Ball  - 1 x daily - 7 x weekly - 3 sets - 10 reps  ASSESSMENT:  CLINICAL IMPRESSION: Pt met all goals.  Pt is expected to maintain current level of function with HEP and will d/c today.   OBJECTIVE IMPAIRMENTS: Abnormal gait, decreased activity tolerance, decreased coordination, decreased endurance, decreased ROM, decreased strength, increased muscle spasms, impaired flexibility, impaired tone, postural dysfunction, and pain.   ACTIVITY LIMITATIONS: sitting, standing, continence, and toileting  PARTICIPATION LIMITATIONS: interpersonal relationship, community activity, and occupation  PERSONAL FACTORS: 1-2 comorbidities: [redacted] weeks gestation, 3 vaginal deliveries, leg length discrepancy  are also affecting patient's functional outcome.   REHAB POTENTIAL: Excellent  CLINICAL DECISION MAKING: Evolving/moderate complexity  EVALUATION COMPLEXITY: Moderate   GOALS: Goals reviewed with patient? Yes  SHORT TERM GOALS: Target date: 08/19/22  Ind with initial HEP Baseline: Goal status: MET    LONG TERM GOALS: Target date: 11/20/22  Ind with HEP for healthy exercise throughout pregnancy Baseline:  Goal status: MET  2.  Pt understands and aware of active labor positions for preparation Baseline:  Goal status: MET  3.  Pt will be able to functional actions such as cough and sneeze without leakage  Baseline: the coughing is getting better and leakage is better Goal status: MET  4.  Pt will have 1/3 or less score of Marinoff scale  Baseline: that has not been painful Goal status: MET  5.  Pt will report 50% reduction of pain due to improvements in posture, strength, and muscle length and maintain as advancing gestation Baseline: 70% better this past Goal status: MET    PLAN:  PT FREQUENCY: 1x/week  PT DURATION: 4 months  PLANNED INTERVENTIONS: Therapeutic exercises, Therapeutic activity, Neuromuscular re-education,  Balance training, Gait training, Patient/Family education, Self Care, Joint mobilization, Aquatic Therapy, Electrical stimulation, Cryotherapy, Moist heat, Taping, Biofeedback, Manual therapy, and Re-evaluation  PLAN FOR NEXT SESSION: d/c   Jule Ser, PT 09/22/2022, 5:10 PM  PHYSICAL THERAPY DISCHARGE SUMMARY  Visits from Start of Care: 6  Current functional level related to goals / functional outcomes: See above goals   Remaining deficits: See above   Education / Equipment: HEP   Patient agrees to discharge. Patient goals were met. Patient is being discharged due to meeting the stated rehab goals.  Gustavus Bryant, PT, DPT 09/22/22 5:16 PM

## 2022-09-23 ENCOUNTER — Other Ambulatory Visit: Payer: Self-pay

## 2022-09-23 DIAGNOSIS — F439 Reaction to severe stress, unspecified: Secondary | ICD-10-CM

## 2022-09-27 NOTE — Telephone Encounter (Signed)
Mother wanting lactation appt.

## 2022-09-28 ENCOUNTER — Ambulatory Visit: Payer: Medicaid Other | Admitting: Physical Therapy

## 2022-10-04 ENCOUNTER — Encounter: Payer: Medicaid Other | Admitting: Physical Therapy

## 2022-10-07 ENCOUNTER — Encounter: Payer: Medicaid Other | Admitting: Licensed Clinical Social Worker

## 2022-10-11 ENCOUNTER — Encounter: Payer: Medicaid Other | Admitting: Physical Therapy

## 2022-10-12 ENCOUNTER — Ambulatory Visit (INDEPENDENT_AMBULATORY_CARE_PROVIDER_SITE_OTHER): Payer: Medicaid Other | Admitting: Obstetrics

## 2022-10-12 ENCOUNTER — Encounter: Payer: Self-pay | Admitting: Medical

## 2022-10-12 VITALS — BP 104/64 | HR 74 | Wt 162.4 lb

## 2022-10-12 DIAGNOSIS — O09522 Supervision of elderly multigravida, second trimester: Secondary | ICD-10-CM | POA: Insufficient documentation

## 2022-10-12 DIAGNOSIS — Z3480 Encounter for supervision of other normal pregnancy, unspecified trimester: Secondary | ICD-10-CM

## 2022-10-12 NOTE — Progress Notes (Signed)
Pt presents for ROB visit. No concerns.  

## 2022-10-12 NOTE — Progress Notes (Signed)
Subjective:  Terri Manning is a 37 y.o. [phone] at [redacted]w[redacted]d being seen today for ongoing prenatal care.  She is currently monitored for the following issues for this low-risk pregnancy and has History of delivery of macrosomal infant; Genital herpes affecting pregnancy, antepartum; Marginal insertion of umbilical cord affecting management of mother; Supervision of other normal pregnancy, antepartum; and AMA (advanced maternal age) multigravida 35+, second trimester on their problem list.  Patient reports no complaints.  Contractions: Not present. Vag. Bleeding: None.  Movement: Present. Denies leaking of fluid.   The following portions of the patient's history were reviewed and updated as appropriate: allergies, current medications, past family history, past medical history, past social history, past surgical history and problem list. Problem list updated.  Objective:   Vitals:   10/12/22 1050  BP: 104/64  Pulse: 74  Weight: 162 lb 6.4 oz (73.7 kg)    Fetal Status:     Movement: Present     General:  Alert, oriented and cooperative. Patient is in no acute distress.  Skin: Skin is warm and dry. No rash noted.   Cardiovascular: Normal heart rate noted  Respiratory: Normal respiratory effort, no problems with respiration noted  Abdomen: Soft, gravid, appropriate for gestational age. Pain/Pressure: Present     Pelvic:  Cervical exam deferred        Extremities: Normal range of motion.  Edema: None  Mental Status: Normal mood and affect. Normal behavior. Normal judgment and thought content.   [phone] [id number]xxxxxxxxxxxxxxxxxxxxxxxxxxxxxxxxxxxxxxxxxxxxxxxxxxxxxxxxxxxxxxxxxxxxxxxxxxxxxxxxxxxxxxxxxxxxxxxxxxxxxxxxxxxxxxxxxxxxxxxxxxxxxxxxxxxxxxxxxxxxxxxxxxxxxxxxxxxxxxxx[id number]        bnvnvnvnvnvnvnvnvnvnvnvnvnvnvnvnvnvnvnvnvnvnvnvnvnvnvnvnvnvnvnvnvnvnvnvnvnvnvnvnvnvnvnvnvnvnvnvnvnvnvnvnvnvnvnvnvm Urinalysis:      Assessment and Plan:  Pregnancy: [id number] at [redacted]w[redacted]d  There are no diagnoses linked to this encounter. Preterm labor symptoms and general obstetric precautions including but not limited to vaginal bleeding, contractions, leaking of fluid and fetal movement were reviewed in detail with the patient. Please refer to After Visit Summary for other counseling recommendations.   Return in about 4 weeks (around 11/09/2022) for ROB.   Brock Bad, MD 10/12/22

## 2022-10-19 ENCOUNTER — Encounter: Payer: Medicaid Other | Admitting: Physical Therapy

## 2022-10-28 ENCOUNTER — Encounter: Payer: Medicaid Other | Admitting: Physical Therapy

## 2022-11-09 ENCOUNTER — Ambulatory Visit (INDEPENDENT_AMBULATORY_CARE_PROVIDER_SITE_OTHER): Payer: Medicaid Other | Admitting: Advanced Practice Midwife

## 2022-11-09 ENCOUNTER — Other Ambulatory Visit: Payer: Medicaid Other

## 2022-11-09 VITALS — BP 104/64 | HR 76 | Wt 159.4 lb

## 2022-11-09 DIAGNOSIS — Z3A28 28 weeks gestation of pregnancy: Secondary | ICD-10-CM | POA: Diagnosis not present

## 2022-11-09 DIAGNOSIS — Z3483 Encounter for supervision of other normal pregnancy, third trimester: Secondary | ICD-10-CM

## 2022-11-09 DIAGNOSIS — Z3480 Encounter for supervision of other normal pregnancy, unspecified trimester: Secondary | ICD-10-CM

## 2022-11-09 DIAGNOSIS — I8393 Asymptomatic varicose veins of bilateral lower extremities: Secondary | ICD-10-CM

## 2022-11-09 NOTE — Progress Notes (Signed)
Pt presents for ROB visit. Declines Tdap. No concerns  

## 2022-11-09 NOTE — Progress Notes (Signed)
   PRENATAL VISIT NOTE  Subjective:  Terri Manning is a 37 y.o. [phone] at [redacted]w[redacted]d being seen today for ongoing prenatal care.  She is currently monitored for the following issues for this low-risk pregnancy and has History of delivery of macrosomal infant; Genital herpes affecting pregnancy, antepartum; Marginal insertion of umbilical cord affecting management of mother; Supervision of other normal pregnancy, antepartum; and AMA (advanced maternal age) multigravida 35+, second trimester on their problem list.  Patient reports  acid reflux, improved with sleeping sitting up .  Contractions: Not present. Vag. Bleeding: None.  Movement: Present. Denies leaking of fluid.   The following portions of the patient's history were reviewed and updated as appropriate: allergies, current medications, past family history, past medical history, past social history, past surgical history and problem list.   Objective:   Vitals:   11/09/22 0854  BP: 104/64  Pulse: 76  Weight: 159 lb 6.4 oz (72.3 kg)    Fetal Status: Fetal Heart Rate (bpm): 149 Fundal Height: 29 cm Movement: Present     General:  Alert, oriented and cooperative. Patient is in no acute distress.  Skin: Skin is warm and dry. No rash noted.   Cardiovascular: Normal heart rate noted  Respiratory: Normal respiratory effort, no problems with respiration noted  Abdomen: Soft, gravid, appropriate for gestational age.  Pain/Pressure: Absent     Pelvic: Cervical exam deferred        Extremities: Normal range of motion.  Edema: None  Mental Status: Normal mood and affect. Normal behavior. Normal judgment and thought content.   Assessment and Plan:  Pregnancy: [id number] at [redacted]w[redacted]d 1. Supervision of other normal pregnancy, antepartum --Anticipatory guidance about next visits/weeks of pregnancy given.   2. [redacted] weeks gestation of pregnancy  - Glucose Tolerance, 2 Hours w/1 Hour - HIV antibody (with reflex) - RPR - CBC  3. Asymptomatic  varicose veins of both lower extremities --referred patient to vein specialist but will not treat during pregnancy.    Preterm labor symptoms and general obstetric precautions including but not limited to vaginal bleeding, contractions, leaking of fluid and fetal movement were reviewed in detail with the patient. Please refer to After Visit Summary for other counseling recommendations.   Return in 4 weeks (on 12/07/2022) for Midwife preferred, LOB.  Future Appointments  Date Time Provider Department Center  12/07/2022  9:55 AM Leftwich-Kirby, Wilmer Floor, CNM [facility] None     Sharen Counter, CNM

## 2022-11-10 LAB — CBC
Hematocrit: 37 % (ref 34.0–46.6)
Hemoglobin: 12.2 g/dL (ref 11.1–15.9)
MCH: 29.3 pg (ref 26.6–33.0)
MCHC: 33 g/dL (ref 31.5–35.7)
MCV: 89 fL (ref 79–97)
Platelets: 323 10*3/uL (ref 150–450)
RBC: 4.17 x10E6/uL (ref 3.77–5.28)
RDW: 11.6 % — ABNORMAL LOW (ref 11.7–15.4)
WBC: 5.4 10*3/uL (ref 3.4–10.8)

## 2022-11-10 LAB — GLUCOSE TOLERANCE, 2 HOURS W/ 1HR
Glucose, 1 hour: 102 mg/dL (ref 70–179)
Glucose, 2 hour: 76 mg/dL (ref 70–152)
Glucose, Fasting: 71 mg/dL (ref 70–91)

## 2022-11-10 LAB — RPR: RPR Ser Ql: NONREACTIVE

## 2022-11-10 LAB — HIV ANTIBODY (ROUTINE TESTING W REFLEX): HIV Screen 4th Generation wRfx: NONREACTIVE

## 2022-12-07 ENCOUNTER — Ambulatory Visit (INDEPENDENT_AMBULATORY_CARE_PROVIDER_SITE_OTHER): Payer: Medicaid Other | Admitting: Advanced Practice Midwife

## 2022-12-07 VITALS — BP 108/65 | HR 82 | Wt 165.2 lb

## 2022-12-07 DIAGNOSIS — Z3A32 32 weeks gestation of pregnancy: Secondary | ICD-10-CM

## 2022-12-07 DIAGNOSIS — Z8759 Personal history of other complications of pregnancy, childbirth and the puerperium: Secondary | ICD-10-CM

## 2022-12-07 DIAGNOSIS — Z3480 Encounter for supervision of other normal pregnancy, unspecified trimester: Secondary | ICD-10-CM

## 2022-12-07 NOTE — Progress Notes (Signed)
   PRENATAL VISIT NOTE  Subjective:  Terri Manning is a 37 y.o. [phone] at [redacted]w[redacted]d being seen today for ongoing prenatal care.  She is currently monitored for the following issues for this low-risk pregnancy and has History of delivery of macrosomal infant; Genital herpes affecting pregnancy, antepartum; Marginal insertion of umbilical cord affecting management of mother; Supervision of other normal pregnancy, antepartum; and AMA (advanced maternal age) multigravida 35+, second trimester on their problem list.  Patient reports no complaints.  Contractions: Not present. Vag. Bleeding: None.  Movement: Present. Denies leaking of fluid.   The following portions of the patient's history were reviewed and updated as appropriate: allergies, current medications, past family history, past medical history, past social history, past surgical history and problem list.   Objective:   Vitals:   12/07/22 1022  BP: 108/65  Pulse: 82  Weight: 165 lb 3.2 oz (74.9 kg)    Fetal Status: Fetal Heart Rate (bpm): 150 Fundal Height: 34 cm Movement: Present  Presentation: Vertex ([place])  General:  Alert, oriented and cooperative. Patient is in no acute distress.  Skin: Skin is warm and dry. No rash noted.   Cardiovascular: Normal heart rate noted  Respiratory: Normal respiratory effort, no problems with respiration noted  Abdomen: Soft, gravid, appropriate for gestational age.  Pain/Pressure: Absent     Pelvic: Cervical exam deferred        Extremities: Normal range of motion.  Edema: None  Mental Status: Normal mood and affect. Normal behavior. Normal judgment and thought content.   Assessment and Plan:  Pregnancy: [id number] at [redacted]w[redacted]d 1. Supervision of other normal pregnancy, antepartum --Anticipatory guidance about next visits/weeks of pregnancy given.   2. [redacted] weeks gestation of pregnancy   3. History of delivery of macrosomal infant --FH ahead today, Hx 9+ lbs infants, no complications with  delivery  Preterm labor symptoms and general obstetric precautions including but not limited to vaginal bleeding, contractions, leaking of fluid and fetal movement were reviewed in detail with the patient. Please refer to After Visit Summary for other counseling recommendations.   Return in about 2 weeks (around 12/21/2022) for Midwife preferred, LOB.  Future Appointments  Date Time Provider Department Center  12/21/2022  2:30 PM Hurshel Party, CNM [facility] None  01/04/2023  9:35 AM Leftwich-Kirby, Wilmer Floor, CNM [facility] None  01/11/2023  9:35 AM Carlynn Herald, CNM [facility] None  01/18/2023  9:55 AM Leftwich-Kirby, Wilmer Floor, CNM [facility] None    Sharen Counter, CNM

## 2022-12-07 NOTE — Progress Notes (Signed)
Pt reports fetal movement, denies pain.  

## 2022-12-21 ENCOUNTER — Ambulatory Visit (INDEPENDENT_AMBULATORY_CARE_PROVIDER_SITE_OTHER): Payer: Medicaid Other | Admitting: Advanced Practice Midwife

## 2022-12-21 ENCOUNTER — Encounter: Payer: Self-pay | Admitting: Advanced Practice Midwife

## 2022-12-21 VITALS — BP 109/64 | HR 79 | Wt 169.9 lb

## 2022-12-21 DIAGNOSIS — Z3A34 34 weeks gestation of pregnancy: Secondary | ICD-10-CM

## 2022-12-21 DIAGNOSIS — Z3493 Encounter for supervision of normal pregnancy, unspecified, third trimester: Secondary | ICD-10-CM

## 2022-12-21 MED ORDER — VALACYCLOVIR HCL 500 MG PO TABS: 500.0000 mg | ORAL_TABLET | Freq: Every day | ORAL | 1 refills | Status: AC

## 2022-12-21 NOTE — Progress Notes (Addendum)
Subjective:  Terri Manning is a 37 y.o. [phone] at [redacted]w[redacted]d being seen today for ongoing prenatal care.  She is currently monitored for the following issues for this low-risk pregnancy and has History of delivery of macrosomal infant; Genital herpes affecting pregnancy, antepartum; Marginal insertion of umbilical cord affecting management of mother; Supervision of other normal pregnancy, antepartum; and AMA (advanced maternal age) multigravida 35+, second trimester on their problem list.  Patient reports no complaints.  Contractions: Irritability. Vag. Bleeding: None.  Movement: Present. Denies leaking of fluid.   The following portions of the patient's history were reviewed and updated as appropriate: allergies, current medications, past family history, past medical history, past social history, past surgical history and problem list. Problem list updated.  Objective:   Vitals:   12/21/22 1439  BP: 109/64  Pulse: 79  Weight: 77.1 kg    Fetal Status: Fetal Heart Rate (bpm): 141 Fundal Height: 35 cm Movement: Present  Presentation: Vertex  General:  Alert, oriented and cooperative. Patient is in no acute distress.  Skin: Skin is warm and dry. No rash noted.   Cardiovascular: Normal heart rate noted  Respiratory: Normal respiratory effort, no problems with respiration noted  Abdomen: Soft, gravid, appropriate for gestational age. Pain/Pressure: Absent     Pelvic: Vag. Bleeding: None     Cervical exam deferred        Extremities: Normal range of motion.  Edema: None  Mental Status: Normal mood and affect. Normal behavior. Normal judgment and thought content.   Urinalysis:      Assessment and Plan:  Pregnancy: [id number] at [redacted]w[redacted]d Supervision of other pregnancy, third trimester - no concerns [redacted] weeks gestation of pregnancy -  Anticipatory guidance for next appointment, GBS testing  Preterm labor symptoms and general obstetric precautions including but not limited to vaginal bleeding,  contractions, leaking of fluid and fetal movement were reviewed in detail with the patient. Please refer to After Visit Summary for other counseling recommendations.    Karis Juba, Student-MidWife   CNM attestation:  I have seen and examined this patient; I agree with above documentation in the midwife student's note.   Terri Manning is a 37 y.o. [phone] in the [facility] office for routine prenatal visit for low risk pregnancy. See problem list below. +FM, denies LOF, VB, contractions, vaginal discharge.  Patient Active Problem List   Diagnosis Date Noted   AMA (advanced maternal age) multigravida 35+, second trimester 10/12/2022   Supervision of other normal pregnancy, antepartum 07/01/2022   Marginal insertion of umbilical cord affecting management of mother 04/23/2021   History of delivery of macrosomal infant 04/07/2021   Genital herpes affecting pregnancy, antepartum 04/07/2021     ROS, labs, PMH reviewed  PE: BP 109/64   Pulse 79   Wt 169 lb 14.4 oz (77.1 kg)   LMP 04/24/2022   BMI 25.09 kg/m  Gen: calm comfortable, well appearing Resp: normal effort, no distress Abd: gravid appropriate for gestational age  Fundal height:35 FHT by doppler:141  Plan: - fetal kick counts reinforced, preterm labor precautions   1. Supervision of low-risk pregnancy, third trimester   2. [redacted] weeks gestation of pregnancy     Sharen Counter, CNM 6:59 PM

## 2022-12-21 NOTE — Progress Notes (Signed)
Pt presents for ROB visit. Requesting Rx for Valtrex

## 2022-12-29 ENCOUNTER — Inpatient Hospital Stay (HOSPITAL_COMMUNITY)
Admission: AD | Admit: 2022-12-29 | Discharge: 2022-12-30 | Disposition: A | Discharge status: Home / Self Care | Payer: Medicaid Other | Attending: Obstetrics & Gynecology | Admitting: Obstetrics & Gynecology

## 2022-12-29 ENCOUNTER — Encounter (HOSPITAL_COMMUNITY): Payer: Self-pay | Admitting: Obstetrics & Gynecology

## 2022-12-29 DIAGNOSIS — Z3A35 35 weeks gestation of pregnancy: Secondary | ICD-10-CM | POA: Insufficient documentation

## 2022-12-29 DIAGNOSIS — I8002 Phlebitis and thrombophlebitis of superficial vessels of left lower extremity: Secondary | ICD-10-CM

## 2022-12-29 DIAGNOSIS — M79605 Pain in left leg: Secondary | ICD-10-CM

## 2022-12-29 DIAGNOSIS — Z8619 Personal history of other infectious and parasitic diseases: Secondary | ICD-10-CM | POA: Insufficient documentation

## 2022-12-29 DIAGNOSIS — O2223 Superficial thrombophlebitis in pregnancy, third trimester: Secondary | ICD-10-CM | POA: Insufficient documentation

## 2022-12-29 NOTE — MAU Note (Signed)
..  Terri Manning is a 37 y.o. at [redacted]w[redacted]d here in [facility]U reporting: varicose vein on left thigh began hurting more than normal yesterday, has been hot and tender. Reports she has been elevating her leg and wearing compression socks, which usually helps, but has not helped today.  Denies vaginal bleeding, leaking of fluid, ctx. +FM  Pain score: 8/10 Vitals:   12/29/22 2340  BP: 99/60  Pulse: 80  Resp: 19  Temp: 97.9 F (36.6 C)  SpO2: 99%     FHT:150

## 2022-12-30 ENCOUNTER — Other Ambulatory Visit (HOSPITAL_COMMUNITY): Payer: Self-pay | Admitting: Advanced Practice Midwife

## 2022-12-30 ENCOUNTER — Other Ambulatory Visit: Payer: Self-pay | Admitting: Certified Nurse Midwife

## 2022-12-30 ENCOUNTER — Ambulatory Visit (HOSPITAL_COMMUNITY)
Admission: RE | Admit: 2022-12-30 | Discharge: 2022-12-30 | Disposition: A | Discharge status: Home / Self Care | Payer: Medicaid Other | Source: Ambulatory Visit | Attending: Advanced Practice Midwife | Admitting: Advanced Practice Midwife

## 2022-12-30 ENCOUNTER — Encounter: Payer: Self-pay | Admitting: Certified Nurse Midwife

## 2022-12-30 DIAGNOSIS — I82819 Embolism and thrombosis of superficial veins of unspecified lower extremities: Secondary | ICD-10-CM

## 2022-12-30 DIAGNOSIS — R52 Pain, unspecified: Secondary | ICD-10-CM

## 2022-12-30 DIAGNOSIS — Z3A35 35 weeks gestation of pregnancy: Secondary | ICD-10-CM

## 2022-12-30 DIAGNOSIS — M79605 Pain in left leg: Secondary | ICD-10-CM | POA: Diagnosis not present

## 2022-12-30 DIAGNOSIS — O2223 Superficial thrombophlebitis in pregnancy, third trimester: Secondary | ICD-10-CM | POA: Diagnosis present

## 2022-12-30 DIAGNOSIS — I8002 Phlebitis and thrombophlebitis of superficial vessels of left lower extremity: Secondary | ICD-10-CM | POA: Diagnosis not present

## 2022-12-30 DIAGNOSIS — Z8619 Personal history of other infectious and parasitic diseases: Secondary | ICD-10-CM | POA: Diagnosis not present

## 2022-12-30 MED ORDER — ENOXAPARIN SODIUM 80 MG/0.8ML IJ SOSY
80.0000 mg | PREFILLED_SYRINGE | Freq: Two times a day (BID) | INTRAMUSCULAR | 0 refills | Status: DC
Start: 2022-12-30 — End: 2022-12-30

## 2022-12-30 MED ORDER — LIDOCAINE 5 % EX PTCH
1.0000 | MEDICATED_PATCH | Freq: Once | CUTANEOUS | Status: DC
  Administered 2022-12-30: 1 via TRANSDERMAL
  Filled 2022-12-30: qty 1

## 2022-12-30 MED ORDER — ENOXAPARIN SODIUM 80 MG/0.8ML IJ SOSY
80.0000 mg | PREFILLED_SYRINGE | Freq: Two times a day (BID) | INTRAMUSCULAR | 0 refills | Status: AC
Start: 2022-12-30 — End: ?

## 2022-12-30 NOTE — Progress Notes (Signed)
Spoke to vascular lab who reported superficial venous thrombosis - not at all connected to deep vein circulation so per their protocol she would not need treatment. Called Dr. Alvester Morin [facility] Attending) who recommended treatment with weight based lovenox BID until 24hrs prior to IOL for thrombosis in pregnancy. Dose calculated with pharmacy help and sent with request to show pt how to self-administer. Message sent to patient and next provider as a head's up.  Edd Arbour, CNM, MSN, IBCLC Certified Nurse Midwife, [facility]

## 2022-12-30 NOTE — Progress Notes (Signed)
VASCULAR LAB    Left lower extremity venous duplex has been performed.  See CV proc for preliminary results.  Called report to a provider in [facility]U  Othell Diluzio, RVT 12/30/2022, 8:46 AM

## 2022-12-30 NOTE — MAU Provider Note (Signed)
Chief Complaint:  No chief complaint on file.   Event Date/Time   First Provider Initiated Contact with Patient 12/30/22 0020     HPI: Terri Manning is a 37 y.o. [phone] at 102w5dwho presents to maternity admissions reporting pain and redness of one of her varicose veins this week   Has had multiple diffuse varicosities and wears compression hose daily   But this one vein has become increasinly painful and red.  Hose are not helping this time. . She reports good fetal movement, denies LOF, vaginal bleeding, vaginal itching/burning, urinary symptoms, h/a, dizziness, n/v, diarrhea, constipation or fever/chills.  She denies headache, visual changes or RUQ abdominal pain.  Other This is a new problem. The problem occurs constantly. The problem has been gradually worsening. Pertinent negatives include no abdominal pain, chest pain, congestion, fatigue, fever or headaches. Exacerbated by: tactile pressure.   RN Note: .Terri Manning is a 37 y.o. at [redacted]w[redacted]d here in MAU reporting: varicose vein on left thigh began hurting more than normal yesterday, has been hot and tender. Reports she has been elevating her leg and wearing compression socks, which usually helps, but has not helped today.  Denies vaginal bleeding, leaking of fluid, ctx. +FM   Pain score: 8/10  Past Medical History: Past Medical History:  Diagnosis Date   Herpes simplex virus (HSV) infection     Past obstetric history: OB History  Gravida Para Term Preterm AB Living  5 3 3  0 1 3  SAB IAB Ectopic Multiple Live Births  1 0 0 0 3    # Outcome Date GA Lbr Len/2nd Weight Sex Delivery Anes PTL Lv  5 Current           4 Term 05/29/21 [redacted]w[redacted]d 03:19 / 00:14 3175 g M Vag-Spont None  LIV  3 Term 04/17/20 [redacted]w[redacted]d  3629 g M Vag-Spont EPI N LIV  2 Term 10/02/12 [redacted]w[redacted]d  4536 g M Vag-Spont EPI N LIV  1 SAB             Past Surgical History: Past Surgical History:  Procedure Laterality Date   APPENDECTOMY      Family History: Family  History  Problem Relation Age of Onset   Diabetes Neg Hx    Hypertension Neg Hx     Social History: Social History   Tobacco Use   Smoking status: Never   Smokeless tobacco: Never  Vaping Use   Vaping Use: Never used  Substance Use Topics   Alcohol use: Not Currently    Alcohol/week: 2.0 standard drinks of alcohol    Types: 2 Glasses of wine per week   Drug use: Never    Allergies:  Allergies  Allergen Reactions   Ceclor [Cefaclor] Nausea And Vomiting    hallucinations   Doxycycline Hives   Erythromycin Hives    Meds:  Medications Prior to Admission  Medication Sig Dispense Refill Last Dose   albuterol (VENTOLIN HFA) 108 (90 Base) MCG/ACT inhaler Inhale 1-2 puffs into the lungs every 4 (four) hours as needed for wheezing or shortness of breath. (Patient not taking: Reported on 07/20/2022) 1 each 1    Blood Pressure Monitoring (BLOOD PRESSURE KIT) DEVI 1 Device by Does not apply route once a week. 1 each 0    ferrous sulfate 325 (65 FE) MG tablet Take 1 tablet (325 mg total) by mouth every other day. 30 tablet 0    Prenatal Vit-Fe Fumarate-FA (PRENATAL PO) Take 1 tablet by mouth daily.  valACYclovir (VALTREX) 500 MG tablet Take 1 tablet (500 mg total) by mouth daily. 60 tablet 1     I have reviewed patient's Past Medical Hx, Surgical Hx, Family Hx, Social Hx, medications and allergies.   ROS:  Review of Systems  Constitutional:  Negative for fatigue and fever.  HENT:  Negative for congestion.   Cardiovascular:  Negative for chest pain.  Gastrointestinal:  Negative for abdominal pain.  Neurological:  Negative for headaches.   Other systems negative  Physical Exam  Patient Vitals for the past 24 hrs:  BP Temp Temp src Pulse Resp SpO2 Height Weight  12/29/22 2340 99/60 97.9 F (36.6 C) Oral 80 19 99 % 5\' 9"  (1.753 m) 77.8 kg   Constitutional: Well-developed, well-nourished female in no acute distress.  Cardiovascular: normal rate  Respiratory: normal  effort GI: Abd soft, non-tender, gravid appropriate for gestational age.   No rebound or guarding. MS: Extremities no edema, normal ROM   Multiple varicosities on both legs.  On left above knee, there is a tender varicose vein with surrounding erythema  Negative Homans sign bilaterally  No ankle edema Neurologic: Alert and oriented x 4.  GU: Neg CVAT.   FHT:  Baseline 135 , moderate variability, accelerations present, no decelerations Contractions:  Rare   Labs: No results found for this or any previous visit (from the past 24 hour(s)). O/Positive/-- (01/11 0981)  Imaging:  No results found.  MAU Course/MDM: I have reviewed the triage vital signs and the nursing notes.   Pertinent labs & imaging results that were available during my care of the patient were reviewed by me and considered in my medical decision making (see chart for details).      I have reviewed her medical records including past results, notes and treatments.   I have ordered Venous doppler studies of legs  Discussed I don't see signs of DVT, but given her multiple varicosities and pain, and her apprehension about DVT risk, will order Dopplers.  Discussed superificial phlebitis is usually treated expectantly with comfort measures.   Not likely to be associated with Deep Vein thrombosis  NST reviewed Consult Dr Charlotta Newton with presentation, exam findings and test results.  Treatments in MAU included Lidoderm patch for pain.    Assessment: Single IUP at [redacted]w[redacted]d Left phlebitis Multiple varicosities Left leg pain  Plan: Discharge home Doppler study of legs in AM Will not give Lovenox now due to no evidence of acute DVT on exam Preterm Labor precautions and fetal kick counts Follow up in Office for prenatal visits and recheck Encouraged to return if she develops worsening of symptoms, increase in pain, fever, or other concerning symptoms.   Pt stable at time of discharge.  Wynelle Bourgeois CNM, MSN Certified  Nurse-Midwife 12/30/2022 12:20 AM

## 2023-01-04 ENCOUNTER — Ambulatory Visit (INDEPENDENT_AMBULATORY_CARE_PROVIDER_SITE_OTHER): Payer: Medicaid Other | Admitting: Advanced Practice Midwife

## 2023-01-04 ENCOUNTER — Other Ambulatory Visit (HOSPITAL_COMMUNITY)
Admission: RE | Admit: 2023-01-04 | Discharge: 2023-01-04 | Disposition: A | Discharge status: Home / Self Care | Payer: Medicaid Other | Source: Ambulatory Visit | Attending: Advanced Practice Midwife | Admitting: Advanced Practice Midwife

## 2023-01-04 ENCOUNTER — Encounter: Payer: Self-pay | Admitting: Advanced Practice Midwife

## 2023-01-04 VITALS — BP 110/74 | HR 98 | Wt 167.0 lb

## 2023-01-04 DIAGNOSIS — Z3480 Encounter for supervision of other normal pregnancy, unspecified trimester: Secondary | ICD-10-CM | POA: Insufficient documentation

## 2023-01-04 DIAGNOSIS — I82819 Embolism and thrombosis of superficial veins of unspecified lower extremities: Secondary | ICD-10-CM

## 2023-01-04 DIAGNOSIS — Z3A36 36 weeks gestation of pregnancy: Secondary | ICD-10-CM

## 2023-01-04 NOTE — Progress Notes (Signed)
Pt presents for ROB visit. Pt refusing Lovenox prescribed at MAU visit 12/29/22.

## 2023-01-04 NOTE — Progress Notes (Signed)
   PRENATAL VISIT NOTE  Subjective:  Terri Manning is a 37 y.o. [phone] at [redacted]w[redacted]d being seen today for ongoing prenatal care.  She is currently monitored for the following issues for this low-risk pregnancy and has History of delivery of macrosomal infant; Genital herpes affecting pregnancy, antepartum; Marginal insertion of umbilical cord affecting management of mother; Supervision of other normal pregnancy, antepartum; and AMA (advanced maternal age) multigravida 35+, second trimester on their problem list.  Patient reports no complaints.  Contractions: Irritability. Vag. Bleeding: None.  Movement: Present. Denies leaking of fluid.   The following portions of the patient's history were reviewed and updated as appropriate: allergies, current medications, past family history, past medical history, past social history, past surgical history and problem list.   Objective:   Vitals:   01/04/23 0949  BP: 110/74  Pulse: 98  Weight: 167 lb (75.8 kg)    Fetal Status: Fetal Heart Rate (bpm): 149 Fundal Height: 36 cm Movement: Present     General:  Alert, oriented and cooperative. Patient is in no acute distress.  Skin: Skin is warm and dry. No rash noted.   Cardiovascular: Normal heart rate noted  Respiratory: Normal respiratory effort, no problems with respiration noted  Abdomen: Soft, gravid, appropriate for gestational age.  Pain/Pressure: Absent     Pelvic: Cervical exam deferred        Extremities: Normal range of motion.  Edema: None  Mental Status: Normal mood and affect. Normal behavior. Normal judgment and thought content.   Assessment and Plan:  Pregnancy: [id number] at [redacted]w[redacted]d 1. Supervision of other normal pregnancy, antepartum --Anticipatory guidance about next visits/weeks of pregnancy given.  - Culture, beta strep (group b only) - Cervicovaginal ancillary only( [place])  2. Superficial thrombosis of lower extremity, unspecified laterality --Pt dx with superficial  thrombosis on 12/30/22.  Outside of pregnancy, she is low risk for DVT, and no tx recommended. --Given pregnancy as increased risk, Dr Alvester Morin recommended Lovenox prophylactic dose daily and IOL at 39 weeks. --Pt has questions about both the medicine and the IOL. She is willing to take the Lovenox postpartum but would prefer not to take it in pregnancy. She asks what the risks are to the baby, why the need for IOL. --She reports the swelling and pain are significantly decreased since her MAU visit, without treatment.  --Message to MFM to ask about recommendations for management of condition and timing of delivery.   3. [redacted] weeks gestation of pregnancy   Preterm labor symptoms and general obstetric precautions including but not limited to vaginal bleeding, contractions, leaking of fluid and fetal movement were reviewed in detail with the patient. Please refer to After Visit Summary for other counseling recommendations.   Return in about 1 week (around 01/11/2023) for As scheduled.  Future Appointments  Date Time Provider Department Center  01/18/2023  9:55 AM Leftwich-Kirby, Wilmer Floor, CNM [facility] None  01/24/2023  8:35 AM Leftwich-Kirby, Wilmer Floor, CNM [facility] None    Sharen Counter, CNM

## 2023-01-05 ENCOUNTER — Encounter: Payer: Self-pay | Admitting: Advanced Practice Midwife

## 2023-01-05 LAB — CERVICOVAGINAL ANCILLARY ONLY
Chlamydia: NEGATIVE
Comment: NEGATIVE
Comment: NEGATIVE
Comment: NORMAL
Neisseria Gonorrhea: NEGATIVE
Trichomonas: NEGATIVE

## 2023-01-08 LAB — CULTURE, BETA STREP (GROUP B ONLY): Strep Gp B Culture: NEGATIVE

## 2023-01-11 ENCOUNTER — Encounter: Payer: Medicaid Other | Admitting: Certified Nurse Midwife

## 2023-01-18 ENCOUNTER — Ambulatory Visit (INDEPENDENT_AMBULATORY_CARE_PROVIDER_SITE_OTHER): Payer: Medicaid Other | Admitting: Advanced Practice Midwife

## 2023-01-18 VITALS — BP 110/65 | HR 81 | Wt 167.2 lb

## 2023-01-18 DIAGNOSIS — Z3480 Encounter for supervision of other normal pregnancy, unspecified trimester: Secondary | ICD-10-CM

## 2023-01-18 DIAGNOSIS — Z3A38 38 weeks gestation of pregnancy: Secondary | ICD-10-CM

## 2023-01-18 DIAGNOSIS — I82819 Embolism and thrombosis of superficial veins of unspecified lower extremities: Secondary | ICD-10-CM

## 2023-01-18 NOTE — Progress Notes (Signed)
Pt presents for ROB visit. Requesting cervical check. 

## 2023-01-18 NOTE — Progress Notes (Signed)
   PRENATAL VISIT NOTE  Subjective:  Terri Manning is a 37 y.o. [phone] at [redacted]w[redacted]d being seen today for ongoing prenatal care.  She is currently monitored for the following issues for this low-risk pregnancy and has History of delivery of macrosomal infant; Genital herpes affecting pregnancy, antepartum; Marginal insertion of umbilical cord affecting management of mother; Supervision of other normal pregnancy, antepartum; and AMA (advanced maternal age) multigravida 35+, second trimester on their problem list.  Patient reports occasional contractions.  Contractions: Irritability. Vag. Bleeding: None.  Movement: Present. Denies leaking of fluid.   The following portions of the patient's history were reviewed and updated as appropriate: allergies, current medications, past family history, past medical history, past social history, past surgical history and problem list.   Objective:   Vitals:   01/18/23 1019  BP: 110/65  Pulse: 81  Weight: 167 lb 3.2 oz (75.8 kg)    Fetal Status: Fetal Heart Rate (bpm): 132 Fundal Height: 39 cm Movement: Present     General:  Alert, oriented and cooperative. Patient is in no acute distress.  Skin: Skin is warm and dry. No rash noted.   Cardiovascular: Normal heart rate noted  Respiratory: Normal respiratory effort, no problems with respiration noted  Abdomen: Soft, gravid, appropriate for gestational age.  Pain/Pressure: Present     Pelvic: Cervical exam performed in the presence of a chaperone        Extremities: Normal range of motion.  Edema: None  Mental Status: Normal mood and affect. Normal behavior. Normal judgment and thought content.   Assessment and Plan:  Pregnancy: [id number] at [redacted]w[redacted]d 1. Supervision of other normal pregnancy, antepartum --Anticipatory guidance about next visits/weeks of pregnancy given.   2. Superficial thrombosis of lower extremity, unspecified laterality --SVT symptoms have improved, mostly resolved --Pt declines  anticoagulation in pregnancy but will begin Lovenox if recommend postpartum  3. [redacted] weeks gestation of pregnancy   Term labor symptoms and general obstetric precautions including but not limited to vaginal bleeding, contractions, leaking of fluid and fetal movement were reviewed in detail with the patient. Please refer to After Visit Summary for other counseling recommendations.   Return in about 1 week (around 01/25/2023) for Midwife preferred.  Future Appointments  Date Time Provider Department Center  01/24/2023  8:35 AM Leftwich-Kirby, Wilmer Floor, CNM [facility] None    Sharen Counter, CNM

## 2023-01-24 ENCOUNTER — Ambulatory Visit (INDEPENDENT_AMBULATORY_CARE_PROVIDER_SITE_OTHER): Payer: Medicaid Other | Admitting: Advanced Practice Midwife

## 2023-01-24 VITALS — BP 108/71 | HR 86 | Wt 168.4 lb

## 2023-01-24 DIAGNOSIS — Z3483 Encounter for supervision of other normal pregnancy, third trimester: Secondary | ICD-10-CM | POA: Diagnosis not present

## 2023-01-24 DIAGNOSIS — I82819 Embolism and thrombosis of superficial veins of unspecified lower extremities: Secondary | ICD-10-CM

## 2023-01-24 DIAGNOSIS — Z3480 Encounter for supervision of other normal pregnancy, unspecified trimester: Secondary | ICD-10-CM

## 2023-01-24 DIAGNOSIS — Z3A39 39 weeks gestation of pregnancy: Secondary | ICD-10-CM

## 2023-01-24 NOTE — Progress Notes (Signed)
   PRENATAL VISIT NOTE  Subjective:  Terri Manning is a 37 y.o. [phone] at [redacted]w[redacted]d being seen today for ongoing prenatal care.  She is currently monitored for the following issues for this low-risk pregnancy and has History of delivery of macrosomal infant; Genital herpes affecting pregnancy, antepartum; Marginal insertion of umbilical cord affecting management of mother; Supervision of other normal pregnancy, antepartum; and AMA (advanced maternal age) multigravida 35+, second trimester on their problem list.  Patient reports no complaints.  Contractions: Irregular. Vag. Bleeding: None.  Movement: Present. Denies leaking of fluid.   The following portions of the patient's history were reviewed and updated as appropriate: allergies, current medications, past family history, past medical history, past social history, past surgical history and problem list.   Objective:   Vitals:   01/24/23 0843  BP: 108/71  Pulse: 86  Weight: 168 lb 6.4 oz (76.4 kg)    Fetal Status: Fetal Heart Rate (bpm): 137 Fundal Height: 39 cm Movement: Present     General:  Alert, oriented and cooperative. Patient is in no acute distress.  Skin: Skin is warm and dry. No rash noted.   Cardiovascular: Normal heart rate noted  Respiratory: Normal respiratory effort, no problems with respiration noted  Abdomen: Soft, gravid, appropriate for gestational age.  Pain/Pressure: Absent     Pelvic: Cervical exam deferred Dilation: 1.5 Effacement (%): 50 Station: -2  Extremities: Normal range of motion.  Edema: None  Mental Status: Normal mood and affect. Normal behavior. Normal judgment and thought content.   Assessment and Plan:  Pregnancy: [id number] at [redacted]w[redacted]d 1. Supervision of other normal pregnancy, antepartum --Anticipatory guidance about next visits/weeks of pregnancy given.  --No barriers to waterbirth at this time --Membranes swept at pt request today --Reviewed labor readiness, including the [employer]   2.  Superficial thrombosis of lower extremity, unspecified laterality --Symptoms have resolved.  Consult with MFM at last visit, no changes to labor management needed per Dr Judeth Cornfield.  --Will review again and pt to consider Lovenox if recommended postpartum  3. [redacted] weeks gestation of pregnancy   Term labor symptoms and general obstetric precautions including but not limited to vaginal bleeding, contractions, leaking of fluid and fetal movement were reviewed in detail with the patient. Please refer to After Visit Summary for other counseling recommendations.   No follow-ups on file.  Future Appointments  Date Time Provider Department Center  02/02/2023  9:15 AM Brand Males, CNM [facility] None    Sharen Counter, CNM

## 2023-01-29 ENCOUNTER — Other Ambulatory Visit: Payer: Self-pay

## 2023-01-29 ENCOUNTER — Inpatient Hospital Stay (HOSPITAL_COMMUNITY): Admission: AD | Admit: 2023-01-29 | Payer: Medicaid Other | Source: Home / Self Care

## 2023-01-29 ENCOUNTER — Encounter (HOSPITAL_COMMUNITY): Payer: Self-pay | Admitting: Obstetrics and Gynecology

## 2023-01-29 ENCOUNTER — Inpatient Hospital Stay (HOSPITAL_COMMUNITY)
Admission: AD | Admit: 2023-01-29 | Discharge: 2023-01-30 | DRG: 806 | Disposition: A | Discharge status: Home / Self Care | Payer: Medicaid Other | Attending: Obstetrics & Gynecology | Admitting: Obstetrics & Gynecology

## 2023-01-29 DIAGNOSIS — A6000 Herpesviral infection of urogenital system, unspecified: Secondary | ICD-10-CM | POA: Diagnosis present

## 2023-01-29 DIAGNOSIS — O09522 Supervision of elderly multigravida, second trimester: Secondary | ICD-10-CM | POA: Diagnosis present

## 2023-01-29 DIAGNOSIS — O9832 Other infections with a predominantly sexual mode of transmission complicating childbirth: Secondary | ICD-10-CM | POA: Diagnosis present

## 2023-01-29 DIAGNOSIS — A6009 Herpesviral infection of other urogenital tract: Secondary | ICD-10-CM | POA: Diagnosis present

## 2023-01-29 DIAGNOSIS — Z3480 Encounter for supervision of other normal pregnancy, unspecified trimester: Secondary | ICD-10-CM

## 2023-01-29 DIAGNOSIS — O24420 Gestational diabetes mellitus in childbirth, diet controlled: Secondary | ICD-10-CM | POA: Diagnosis present

## 2023-01-29 DIAGNOSIS — O4423 Partial placenta previa NOS or without hemorrhage, third trimester: Secondary | ICD-10-CM | POA: Diagnosis not present

## 2023-01-29 DIAGNOSIS — O26893 Other specified pregnancy related conditions, third trimester: Secondary | ICD-10-CM | POA: Diagnosis present

## 2023-01-29 DIAGNOSIS — Z3A40 40 weeks gestation of pregnancy: Secondary | ICD-10-CM

## 2023-01-29 DIAGNOSIS — O43199 Other malformation of placenta, unspecified trimester: Secondary | ICD-10-CM | POA: Diagnosis present

## 2023-01-29 DIAGNOSIS — O98319 Other infections with a predominantly sexual mode of transmission complicating pregnancy, unspecified trimester: Secondary | ICD-10-CM | POA: Diagnosis present

## 2023-01-29 DIAGNOSIS — O43123 Velamentous insertion of umbilical cord, third trimester: Secondary | ICD-10-CM | POA: Diagnosis present

## 2023-01-29 DIAGNOSIS — O09523 Supervision of elderly multigravida, third trimester: Secondary | ICD-10-CM | POA: Diagnosis not present

## 2023-01-29 DIAGNOSIS — Z8759 Personal history of other complications of pregnancy, childbirth and the puerperium: Secondary | ICD-10-CM

## 2023-01-29 DIAGNOSIS — O480 Post-term pregnancy: Secondary | ICD-10-CM | POA: Diagnosis not present

## 2023-01-29 LAB — CBC
HCT: 39.4 % (ref 36.0–46.0)
Hemoglobin: 13 g/dL (ref 12.0–15.0)
MCH: 29.5 pg (ref 26.0–34.0)
MCHC: 33 g/dL (ref 30.0–36.0)
MCV: 89.3 fL (ref 80.0–100.0)
Platelets: 229 10*3/uL (ref 150–400)
RBC: 4.41 MIL/uL (ref 3.87–5.11)
RDW: 13.1 % (ref 11.5–15.5)
WBC: 6.5 10*3/uL (ref 4.0–10.5)
nRBC: 0 % (ref 0.0–0.2)

## 2023-01-29 LAB — TYPE AND SCREEN
ABO/RH(D): O POS
Antibody Screen: NEGATIVE

## 2023-01-29 LAB — RPR: RPR Ser Ql: NONREACTIVE

## 2023-01-29 MED ORDER — COCONUT OIL OIL: 1.0000 | TOPICAL_OIL | Status: DC | PRN

## 2023-01-29 MED ORDER — OXYCODONE HCL 5 MG PO TABS: 10.0000 mg | ORAL_TABLET | ORAL | Status: DC | PRN

## 2023-01-29 MED ORDER — LIDOCAINE HCL (PF) 1 % IJ SOLN: 30.0000 mL | INTRAMUSCULAR | Status: DC | PRN

## 2023-01-29 MED ORDER — ACETAMINOPHEN 325 MG PO TABS: 650.0000 mg | ORAL_TABLET | ORAL | Status: DC | PRN

## 2023-01-29 MED ORDER — WITCH HAZEL-GLYCERIN EX PADS: 1.0000 | MEDICATED_PAD | CUTANEOUS | Status: DC | PRN

## 2023-01-29 MED ORDER — OXYTOCIN-SODIUM CHLORIDE 30-0.9 UT/500ML-% IV SOLN: 2.5000 [IU]/h | INTRAVENOUS | Status: DC

## 2023-01-29 MED ORDER — ONDANSETRON HCL 4 MG PO TABS: 4.0000 mg | ORAL_TABLET | ORAL | Status: DC | PRN

## 2023-01-29 MED ORDER — SOD CITRATE-CITRIC ACID 500-334 MG/5ML PO SOLN: 30.0000 mL | ORAL | Status: DC | PRN

## 2023-01-29 MED ORDER — PRENATAL MULTIVITAMIN CH: 1.0000 | ORAL_TABLET | Freq: Every day | ORAL | Status: DC

## 2023-01-29 MED ORDER — FENTANYL CITRATE (PF) 100 MCG/2ML IJ SOLN: 50.0000 ug | INTRAMUSCULAR | Status: DC | PRN

## 2023-01-29 MED ORDER — DIBUCAINE (PERIANAL) 1 % EX OINT: 1.0000 | TOPICAL_OINTMENT | CUTANEOUS | Status: DC | PRN

## 2023-01-29 MED ORDER — LACTATED RINGERS IV SOLN: INTRAVENOUS | Status: DC

## 2023-01-29 MED ORDER — OXYTOCIN BOLUS FROM INFUSION: 333.0000 mL | Freq: Once | INTRAVENOUS | Status: DC

## 2023-01-29 MED ORDER — LACTATED RINGERS IV SOLN: 500.0000 mL | INTRAVENOUS | Status: DC | PRN

## 2023-01-29 MED ORDER — OXYTOCIN 10 UNIT/ML IJ SOLN: 10.0000 [IU] | Freq: Once | INTRAMUSCULAR | Status: AC

## 2023-01-29 MED ORDER — ONDANSETRON HCL 4 MG/2ML IJ SOLN: 4.0000 mg | INTRAMUSCULAR | Status: DC | PRN

## 2023-01-29 MED ORDER — OXYTOCIN 10 UNIT/ML IJ SOLN
INTRAMUSCULAR | Status: AC
  Administered 2023-01-29: 10 [IU] via INTRAMUSCULAR
  Filled 2023-01-29: qty 1

## 2023-01-29 MED ORDER — ONDANSETRON HCL 4 MG/2ML IJ SOLN: 4.0000 mg | Freq: Four times a day (QID) | INTRAMUSCULAR | Status: DC | PRN

## 2023-01-29 MED ORDER — OXYCODONE HCL 5 MG PO TABS: 5.0000 mg | ORAL_TABLET | ORAL | Status: DC | PRN

## 2023-01-29 MED ORDER — SIMETHICONE 80 MG PO CHEW: 80.0000 mg | CHEWABLE_TABLET | ORAL | Status: DC | PRN

## 2023-01-29 MED ORDER — DOCUSATE SODIUM 100 MG PO CAPS
100.0000 mg | ORAL_CAPSULE | Freq: Two times a day (BID) | ORAL | Status: DC
  Filled 2023-01-29: qty 1

## 2023-01-29 MED ORDER — IBUPROFEN 600 MG PO TABS
600.0000 mg | ORAL_TABLET | Freq: Four times a day (QID) | ORAL | Status: DC
  Administered 2023-01-30: 600 mg via ORAL
  Filled 2023-01-29: qty 1

## 2023-01-29 MED ORDER — DIPHENHYDRAMINE HCL 25 MG PO CAPS: 25.0000 mg | ORAL_CAPSULE | Freq: Four times a day (QID) | ORAL | Status: DC | PRN

## 2023-01-29 MED ORDER — BENZOCAINE-MENTHOL 20-0.5 % EX AERO: 1.0000 | INHALATION_SPRAY | CUTANEOUS | Status: DC | PRN

## 2023-01-29 NOTE — MAU Note (Signed)
Pt states she will decline IV access at this time, however, will allow for lab work.

## 2023-01-29 NOTE — MAU Note (Signed)
.  Terri Manning is a 37 y.o. at [redacted]w[redacted]d here in MAU reporting:   Contractions every: 2-3 minutes Onset of ctx: Today 0215 Pain score: 8/10  ROM: Intact Vaginal Bleeding: None Last SVE: 1.5/50/-2 Mon  Epidural: Not planning  Fetal Movement: Reports positive FM FHT:145 via External  Vitals:   01/29/23 0501  BP: 112/77  Pulse: 97  Resp: 18  Temp: 99 F (37.2 C)  SpO2: 99%       OB Office: Faculty GBS: Negative HSV: HSV, outbreak in 10 years, on Valtrex Lab orders placed from triage: MAU Labor Eval

## 2023-01-29 NOTE — MAU Note (Signed)
This RN at bedside; per CNM this RN made patient aware that at this time there is only J Emly CNM in [facility] and is currently in delivery with different water birth patient. Due to this reasoning CNM wanted this RN to make patient aware of possibility that CNM may not be available for this patients waterbirth. Patient verbalized understanding at this time.

## 2023-01-29 NOTE — H&P (Addendum)
Terri Manning is a 37 y.o. female, [phone] at 40 weeks, presenting for active labor.  Patient states contractions started at 0215 and have been every 2-3 minutes. She denies LOF or VB and endorses fetal movement.  Patient receives care at [facility] and was supervised for a low- risk pregnancy. Pregnancy and medical history significant for problems as listed below. She is GBS negative and expresses a desire for no medication for pain management.  She requests no method for PP birth control method.     Patient Active Problem List   Diagnosis Date Noted   Indication for care in labor or delivery 01/29/2023   AMA (advanced maternal age) multigravida 35+, second trimester 10/12/2022   Supervision of other normal pregnancy, antepartum 07/01/2022   Marginal insertion of umbilical cord affecting management of mother 04/23/2021   History of delivery of macrosomal infant 04/07/2021   Genital herpes affecting pregnancy, antepartum 04/07/2021    History of present pregnancy:  Last evaluation:  January 24, 2023 in office by L. Leftwich-Kirby, CNM  BP: 108/71  Pulse: 86  Weight: 168 lb 6.4 oz (76.4 kg)           Nursing Staff Provider  Office Location [facility] Dating  U/S 12/29/20 EDD: 05/20/21  Language  English Anatomy US     Flu Vaccine  declined Genetic Screen  NIPS:   AFP:   First Screen:  Quad:     TDaP Vaccine   declined Hgb A1C or  GTT Early  Third trimester   COVID Vaccine none   LAB RESULTS   Rhogam  N/A Blood Type  O Pos  Feeding Plan breast Antibody  Negative  Contraception condoms Rubella  Immune  Circumcision no RPR  Negative  Pediatrician  [place], [place], [facility] HBsAg Negative (07/14 0000)   Support Person [place] And Doula HCVAb    Prenatal Classes  Waterbirth HIV  Non Reactive  BTL Consent NA GBS  Negative (For PCN allergy, check sensitivities)   VBAC Consent   Pap 2019, needs pp      Hgb Electro  Negative  BP Cuff   CF    PHQ-9/GAD-7  [X]  36 WEEKS SMA         Waterbirth  [ ]  Class [ ]  Consent [ ]  CNM visit      Induction  [ ]  Orders Entered [ ] Foley Y/N       OB History     Gravida  5   Para  3   Term  3   Preterm  0   AB  1   Living  3      SAB  1   IAB  0   Ectopic  0   Multiple  0   Live Births  3             Past Medical History:  Diagnosis Date   Herpes simplex virus (HSV) infection    Past Surgical History:  Procedure Laterality Date   APPENDECTOMY     TONSILLECTOMY     Family History: family history is not on file. Social History:  reports that she has never smoked. She has never used smokeless tobacco. She reports that she does not currently use alcohol after a past usage of about 2.0 standard drinks of alcohol per week. She reports that she does not use drugs.   Prenatal Transfer Tool  Maternal Diabetes: No Genetic Screening: Normal Maternal Ultrasounds/Referrals: Other:Marginal Cord Insertion Fetal Ultrasounds or other Referrals:  None Maternal Substance Abuse:  No Significant Maternal Medications:  None Significant Maternal Lab Results: Group B Strep negative   Maternal Assessment:  ROS: +Contractions, -LOF, -Vaginal Bleeding, +Fetal Movement  All other systems reviewed and negative.    Allergies  Allergen Reactions   Azithromycin Hives   Ceclor [Cefaclor] Nausea And Vomiting    hallucinations   Doxycycline Hives     Dilation: 5 Effacement (%): 70 Station: -2 Exam by:: Ana R, RN Blood pressure 117/75, pulse 99, temperature 98 F (36.7 C), temperature source Oral, resp. rate 17, height 5\' 9"  (1.753 m), weight 76.2 kg, last menstrual period 04/24/2022, SpO2 99%, currently breastfeeding.  Physical Exam Vitals reviewed.  Constitutional:      Appearance: Normal appearance.  HENT:     Head: Normocephalic and atraumatic.  Eyes:     Conjunctiva/sclera: Conjunctivae normal.  Cardiovascular:     Rate and Rhythm: Normal rate.  Pulmonary:     Effort: Pulmonary effort is normal.  No respiratory distress.  Musculoskeletal:        General: Normal range of motion.     Cervical back: Normal range of motion.  Neurological:     Mental Status: She is alert and oriented to person, place, and time.  Psychiatric:        Mood and Affect: Mood normal.        Behavior: Behavior normal.     Fetal Assessment: Leop[place]: -Pelvis: Proven to 4536g -EFW:  -Presentation: Vertex by Nurse Exam  FHR: 140 bpm, Mod Var, -Decels, +Accels UCs:  Irritability Graphed    Assessment IUP at 40 weeks Cat I FT Active Labor H/o Superficial Vein Thrombosis AMA H/o Marcosomal Infant  Plan: -L&D Admit Orders -Patient desires WB. -Informed that this provider will be going off shift, but will report to other midwife and certified MD. -Expectant mgmt at current -Okay for intermittent monitoring.    Joellyn Quails, MSN 01/29/2023, 7:53 AM

## 2023-01-29 NOTE — Lactation Note (Signed)
This note was copied from a baby's chart. Lactation Consultation Note  Patient Name: Terri Manning [id number] Date: 01/29/2023 Age:35 hours Reason for consult: Initial assessment;Term.  P4, term female infant, LC discussed infant's input and output and infant had one void and one stool since delivery. Birth Parent feels infant is latching well at the breast, she feels infant may have lip and tongue tie like  her siblings, she will ask Pediatrician, MD to assess infant's oral anatomy. Per Birth Parent, two of her children had frenotomy to release the lingual frenulum.  This is infant's 4 th latch since birth, Birth Parent had infant latched on her left breast using the cradle hold, infant was still breastfeeding after 28 minutes when LC left the room. Due to infant being latch, LC unable to observe infant's oral anatomy. Birth Parent will continue to BF infant by cues, on demand, 8 to 12+ times. Birth Parent is experienced with breastfeeding see maternal data below. Birth Parent knows to call RN/LC for latch assistance if needed.  LC discussed the importance of maternal rest, diet and hydration.Birth Parent was made aware of O/P services, breastfeeding support groups, community resources, and our phone # for post-discharge questions.    Maternal Data Has patient been taught Hand Expression?: Yes Does the patient have breastfeeding experience prior to this delivery?: Yes How long did the patient breastfeed?: Per Birth Parent her other three children all had lip ties, two were clipped, she mostly pumped for 1st child do to difficult latch, 2nd child BF for 6 months, and 3rd 1 year, her 3rd child is now 18 months.  Feeding Mother's Current Feeding Choice: Breast Milk  LATCH Score Latch: Grasps breast easily, tongue down, lips flanged, rhythmical sucking.  Audible Swallowing: A few with stimulation  Type of Nipple: Everted at rest and after stimulation  Comfort (Breast/Nipple): Soft /  non-tender  Hold (Positioning): No assistance needed to correctly position infant at breast.  LATCH Score: 9   Lactation Tools Discussed/Used    Interventions Interventions: Breast feeding basics reviewed;Position options;Skin to skin;Breast compression;Hand express;LC Services brochure;Education  Discharge Pump: DEBP;Personal  Consult Status Consult Status: Follow-up Date: 01/30/23 Follow-up type: In-patient    Frederico Hamman 01/29/2023, 4:09 PM

## 2023-01-29 NOTE — Progress Notes (Signed)
In tub, coping well and labor progressing.  Moved to kneeling  FHR appropriate through contractions  Await delivery  Federico Flake, MD

## 2023-01-29 NOTE — Progress Notes (Signed)
Coping well in waterbirth tub.  Contractions are about every 1-2 minutes. No blood show yet.  Supported by partner Insurance claims handler. Doula went to grab food.   I checked in with RN and I will plan to go down to the MAU. Plan to check back in with patient in 1 hr if not needed before.   Federico Flake, MD

## 2023-01-29 NOTE — Progress Notes (Signed)
Report received from Gerrit Heck at [facility]  Patient with expressed desire for waterbirth and prior delivery was successful in water.  Currently having q2-3 minute contractions. Last cervical exam check was 5cm  Fetal monitoring before, during and after contraction was performed in my prescence and WNL.  140s/moderate variability  Patient does not have any of the following contraindications for waterbirth.   History of c-section  Preterm birth less than 37 weeks  Thick, particulate meconium-stained fluid  Maternal fever over 101 degrees Fahrenheit  Heavy bleeding or signs of placental abruption  Pre-eclampsia, Chronic hypertension or Gestational Hypertension  Any abnormal fetal heart rate pattern  If epidural analgesia is utilized during Chief Executive [employer]  Multiple gestation pregnancy  Active communicable disease   Significant limitation to mobility  Preexisting Diabetes, A2 Gestational diabetes or Uncontrolled A1 Gestational diabetes  Any other indication based on medical provider discretion  Reviewed risk/benefits of waterbirth. Specifically reviewed risk fo infant inhaling water, infection, need to be removed quickly, difficulty assessing FHR or vaginal bleeding.  Signed consent with patient, RN, doula and husband present. OK to get in the water.   Federico Flake, MD

## 2023-01-30 MED ORDER — DOCUSATE SODIUM 100 MG PO CAPS: 100.0000 mg | ORAL_CAPSULE | Freq: Two times a day (BID) | ORAL | 0 refills | Status: AC

## 2023-01-30 MED ORDER — IBUPROFEN 600 MG PO TABS: 600.0000 mg | ORAL_TABLET | Freq: Four times a day (QID) | ORAL | 0 refills | Status: DC

## 2023-01-30 MED ORDER — ACETAMINOPHEN 325 MG PO TABS: 650.0000 mg | ORAL_TABLET | ORAL | 0 refills | Status: AC | PRN

## 2023-01-30 NOTE — Discharge Summary (Signed)
Postpartum Discharge Summary     Patient Name: Terri Manning DOB: May 18, 1986 MRN: [id number]  Date of admission: 01/29/2023 Delivery date:01/29/2023 Delivering provider: Federico Flake Date of discharge: 01/30/2023  Admitting diagnosis: Indication for care in labor or delivery [O75.9] Intrauterine pregnancy: [redacted]w[redacted]d     Secondary diagnosis:  Principal Problem:   SVD (spontaneous vaginal delivery) Active Problems:   History of delivery of macrosomal infant   Genital herpes affecting pregnancy, antepartum   Marginal insertion of umbilical cord affecting management of mother   Supervision of other normal pregnancy, antepartum   AMA (advanced maternal age) multigravida 35+, second trimester   Indication for care in labor or delivery  Additional problems: none    Discharge diagnosis: Term Pregnancy Delivered                                              Post partum procedures: none  Augmentation:  Water birth  Complications: None  Hospital course: Onset of Labor With Vaginal Delivery      37 y.o. yo [id number] at [redacted]w[redacted]d was admitted in Active Labor on 01/29/2023. Labor course was complicated by n/a.   Membrane Rupture Time/Date: 10:59 AM,01/29/2023  Delivery Method:Vaginal, Spontaneous Operative Delivery:N/A Episiotomy: None Lacerations:  None Patient had a postpartum course complicated by n/a.  She is ambulating, tolerating a regular diet, passing flatus, and urinating well. Patient is discharged home in stable condition on 01/30/23.  Newborn Data: Birth date:01/29/2023 Birth time:10:59 AM Gender:Female Living status:Living Apgars:8 ,9  Weight:3300 g  Magnesium Sulfate received: No BMZ received: No Rhophylac:N/A MMR:No, mother declined  T-DaP: mother declined Flu: mother declined  Transfusion:No  Physical exam  Vitals:   01/29/23 1328 01/29/23 1445 01/29/23 1838 01/29/23 2233  BP: 126/71 109/67 116/68 111/68  Pulse: 78 79 65 94  Resp: 18 18 18 16   Temp: 97.8  F (36.6 C) (!) 97.4 F (36.3 C) 97.9 F (36.6 C) 98.2 F (36.8 C)  TempSrc:  Oral Oral Oral  SpO2: 98% 97% 98% 93%  Weight:      Height:       General: alert, cooperative, and no distress Lochia: appropriate Uterine Fundus: firm Incision: N/A DVT Evaluation: No evidence of DVT seen on physical exam. Labs: Lab Results  Component Value Date   WBC 6.5 01/29/2023   HGB 13.0 01/29/2023   HCT 39.4 01/29/2023   MCV 89.3 01/29/2023   PLT 229 01/29/2023      Latest Ref Rng & Units 07/12/2022    9:17 AM  CMP  Glucose 70 - 99 mg/dL 83   BUN 6 - 20 mg/dL 9   Creatinine 6.06 - 3.01 mg/dL 6.01   Sodium 093 - 235 mmol/L 135   Potassium 3.5 - 5.1 mmol/L 3.8   Chloride 98 - 111 mmol/L 101   CO2 22 - 32 mmol/L 25   Calcium 8.9 - 10.3 mg/dL 9.0   Total Protein 6.5 - 8.1 g/dL 6.8   Total Bilirubin 0.3 - 1.2 mg/dL 0.4   Alkaline Phos 38 - 126 U/L 53   AST 15 - 41 U/L 19   ALT 0 - 44 U/L 15    Edinburgh Score:    01/29/2023   10:34 PM  Edinburgh Postnatal Depression Scale Screening Tool  I have been able to laugh and see the funny side of things. 0  I have looked forward with enjoyment to things. 0  I have blamed myself unnecessarily when things went wrong. 0  I have been anxious or worried for no good reason. 0  I have felt scared or panicky for no good reason. 0  Things have been getting on top of me. 0  I have been so unhappy that I have had difficulty sleeping. 0  I have felt sad or miserable. 0  I have been so unhappy that I have been crying. 0  The thought of harming myself has occurred to me. 0  Edinburgh Postnatal Depression Scale Total 0     After visit meds:  Allergies as of 01/30/2023       Reactions   Azithromycin Hives   Ceclor [cefaclor] Nausea And Vomiting   hallucinations   Doxycycline Hives        Medication List     TAKE these medications    acetaminophen 325 MG tablet Commonly known as: Tylenol Take 2 tablets (650 mg total) by mouth every 4  (four) hours as needed for moderate pain (for pain scale < 4).   albuterol 108 (90 Base) MCG/ACT inhaler Commonly known as: VENTOLIN HFA Inhale 1-2 puffs into the lungs every 4 (four) hours as needed for wheezing or shortness of breath.   Blood Pressure Kit Devi 1 Device by Does not apply route once a week.   docusate sodium 100 MG capsule Commonly known as: COLACE Take 1 capsule (100 mg total) by mouth 2 (two) times daily.   enoxaparin 80 MG/0.8ML injection Commonly known as: Lovenox Inject 0.8 mLs (80 mg total) into the skin every 12 (twelve) hours.   ferrous sulfate 325 (65 FE) MG tablet Take 1 tablet (325 mg total) by mouth every other day.   ibuprofen 600 MG tablet Commonly known as: ADVIL Take 1 tablet (600 mg total) by mouth every 6 (six) hours.   PRENATAL PO Take 1 tablet by mouth daily.   valACYclovir 500 MG tablet Commonly known as: VALTREX Take 1 tablet (500 mg total) by mouth daily.         Discharge home in stable condition Infant Feeding: Breast Infant Disposition:home with mother Discharge instruction: per After Visit Summary and Postpartum booklet. Activity: Advance as tolerated. Pelvic rest for 6 weeks.  Diet: routine diet Future Appointments: Future Appointments  Date Time Provider Department Center  02/02/2023  9:15 AM Brand Males, CNM [facility] None   Follow up Visit: Message sent 8/11 to [facility]   Please schedule this patient for a In person postpartum visit in 6 weeks with the following provider: Any provider. Additional Postpartum F/U: routine PP   Low risk pregnancy complicated by:  n/a  Delivery mode:  Vaginal, Spontaneous Anticipated Birth Control:  Unsure   01/30/2023 Hessie Dibble, MD

## 2023-02-01 ENCOUNTER — Telehealth: Payer: Self-pay

## 2023-02-01 NOTE — Telephone Encounter (Signed)
Patient called stating that she passed a large clot the size of the palm of her hand. Patient states that sh did a lot more moving around today after taking newborn to her newborn appt. Patient states that this only occurred once. Feels well overall. Denies feeling faint or dizzy. States that she feels the the large clot was related to increased activity today. Patient advised to monitor for now, and if this happens again and begin to feel dizzy or faint to be evaluated at [facility]

## 2023-02-25 ENCOUNTER — Telehealth (HOSPITAL_COMMUNITY): Payer: Self-pay | Admitting: *Deleted

## 2023-02-25 NOTE — Telephone Encounter (Signed)
02/25/2023  Name: Ramla Peper MRN: [id number] DOB: 05-29-1986  Reason for Call:  Transition of Care Hospital Discharge Call  Contact Status: Patient Contact Status: Message  Language assistant needed:          Follow-Up Questions:    Inocente Salles Postnatal Depression Scale:  In the Past 7 Days:    PHQ2-9 Depression Scale:     Discharge Follow-up:    Post-discharge interventions: NA  Salena Saner, RN 9/6/204 13:55

## 2023-03-14 ENCOUNTER — Ambulatory Visit: Payer: Medicaid Other | Admitting: Advanced Practice Midwife

## 2023-04-11 ENCOUNTER — Encounter: Payer: Self-pay | Admitting: Podiatry

## 2023-04-11 ENCOUNTER — Ambulatory Visit: Payer: Medicaid Other | Admitting: Podiatry

## 2023-04-11 VITALS — Ht 69.0 in | Wt 168.0 lb

## 2023-04-11 DIAGNOSIS — B070 Plantar wart: Secondary | ICD-10-CM

## 2023-04-11 DIAGNOSIS — D492 Neoplasm of unspecified behavior of bone, soft tissue, and skin: Secondary | ICD-10-CM | POA: Diagnosis not present

## 2023-04-11 NOTE — Patient Instructions (Signed)
VISIT SUMMARY:  During your visit, we discussed your concern about the multiple painful warts on your foot. Despite your attempts to treat them at home with over-the-counter remedies, they have not improved significantly. The most painful wart is under your foot, making it difficult for you to wear shoes. You also have warts on your big toe and other areas of your foot.  YOUR PLAN:  -PLANTAR WARTS: Plantar warts are small growths that usually appear on the heels or other weight-bearing areas of your feet. This pressure may also cause plantar warts to grow inward beneath a hard, thick layer of skin (callus). We have removed the top layer of each wart on your left foot and applied a special acid to each one. You should remove the bandage after 24 hours.  INSTRUCTIONS:  Please return for a follow-up appointment in 4 weeks so we can assess your progress. If you notice any changes or if the pain increases, please contact the office immediately.

## 2023-04-12 NOTE — Progress Notes (Signed)
  Subjective:  Patient ID: Terri Manning, female    DOB: 22-Apr-1986,  MRN: [id number]  Chief Complaint  Patient presents with   Plantar Fasciitis    Left foot plantar wart, pt states it is painful to walk, been using otc meds with no relief.    Discussed the use of AI scribe software for clinical note transcription with the patient, who gave verbal consent to proceed.  History of Present Illness   The patient, a busy parent of three young children, presents with multiple painful foot warts. She has been self-treating with over-the-counter remedies, including 'black ointment' and wart remover pads, but has not seen significant improvement. The most painful wart is located on the foot, making it difficult to wear shoes. The patient also has warts on the big toe and two smaller ones on other areas of the foot. The patient initially thought the wart on the big toe was a piece of glass from a broken mason jar, but after home removal of the glass, the pain persisted and additional warts appeared. The patient has not been able to consistently soak the foot in Epsom salt as recommended by her primary care provider due to time constraints.          Objective:    Physical Exam   EXTREMITIES: Foot warm, well perfused, palpable pulses present. SKIN: Diagnosis of verruca plantaris with multiple lesions located under the fifth metatarsal head, third metatarsal head, tip of the third toe, and plantar aspect of the hallux on the left foot.       No images are attached to the encounter.    Results   Procedure: Debridement and enucleation of verruca plantaris Description: Each lesion was debrided and enucleated with a sharp scalpel. An aperture pad was applied around the lesion and trichloroacetic acid was applied to the lesion and dressed with a bandage. Informed Consent: Counseling about the use of a higher strength acid to form a peeling blister over the next couple of days. Discussion of the  ineffectiveness of freezing due to the thickness of the skin on the foot. Instructions to leave the pad on for 24 hours and then wash with soap and water. Follow-up appointment scheduled in a month to assess progress.      Assessment:   1. Verruca plantaris      Plan:  Patient was evaluated and treated and all questions answered.  Assessment and Plan    Plantar Warts   Debrided and enucleated each lesion on her left foot, where multiple painful plantar warts were present, with the most painful one under the fifth metatarsal head. Applied trichloroacetic acid to each lesion and dressed with a bandage, which she is advised to remove in 24 hours. Despite previous unsuccessful attempts at home treatment with over-the-counter wart remover pads and black ointment, a follow-up appointment is scheduled in 4 weeks to assess progress.          Return in about 4 weeks (around 05/09/2023) for wart treatment.

## 2023-05-09 ENCOUNTER — Encounter: Payer: Self-pay | Admitting: Podiatry

## 2023-05-09 ENCOUNTER — Ambulatory Visit: Payer: Medicaid Other | Admitting: Podiatry

## 2023-05-09 DIAGNOSIS — D492 Neoplasm of unspecified behavior of bone, soft tissue, and skin: Secondary | ICD-10-CM

## 2023-05-11 ENCOUNTER — Encounter: Payer: Self-pay | Admitting: Podiatry

## 2023-05-11 NOTE — Progress Notes (Signed)
  Subjective:  Patient ID: Terri Manning, female    DOB: 23-Nov-1985,  MRN: [id number]  Chief Complaint  Patient presents with   Plantar Warts    RM#20 follow up on warts on left foot and now has one on the right.      Discussed the use of AI scribe software for clinical note transcription with the patient, who gave verbal consent to proceed.  History of Present Illness   She notes that in do much or blister greatly      Objective:    Physical Exam   EXTREMITIES: Foot warm, well perfused, palpable pulses present. SKIN: Diagnosis of verruca plantaris with multiple lesions located under the fifth metatarsal head, third metatarsal head, tip of the third toe, and plantar aspect of the hallux on the left foot.  Solitary lesion on right foot       Assessment:   Encounter Diagnosis  Name Primary?   Neoplasm of skin of lower leg Yes      Plan:  Patient was evaluated and treated and all questions answered.   Discussed etiology and treatment of verruca plantaris in detail with the patient as well as multiple treatment options including blistering agents, chemotherapeutic agents, surgical excision, laser therapy and the indications and roles of the above.  Today, recommended treatment with Cantharone as noted in procedure note below.  Follow-up in 4 weeks for reevaluation  Procedure: Destruction of Lesion Location: Bilateral foot Instrumentation: 15 blade. Technique: Debridement of lesion to petechial bleeding. Aperture pad applied around lesion. Small amount of canthrone applied to the base of the lesion. Dressing: Dry, sterile, compression dressing. Disposition: Patient tolerated procedure well. Advised to leave dressing on for 12 hours. Thereafter patient to wash the area with soap and water and applied band-aid. Off-loading pads dispensed. Patient to return in 4 weeks for follow-up.    No follow-ups on file.

## 2023-11-23 IMAGING — US US PELVIS LIMITED
1 series · 15 of 25 positions shown · non-contrast
Comparison: None.

CLINICAL DATA: Initial evaluation for acute abdominal pain, recent
vaginal delivery.

EXAM:
TRANSABDOMINAL ULTRASOUND OF PELVIS
TECHNIQUE: Transabdominal ultrasound examination of the pelvis was performed
including evaluation of the uterus, ovaries, adnexal regions, and
pelvic cul-de-sac.

[Series 1: us pelvis limited · 27 acquisitions, 15 frames shown]
[im 1/27]
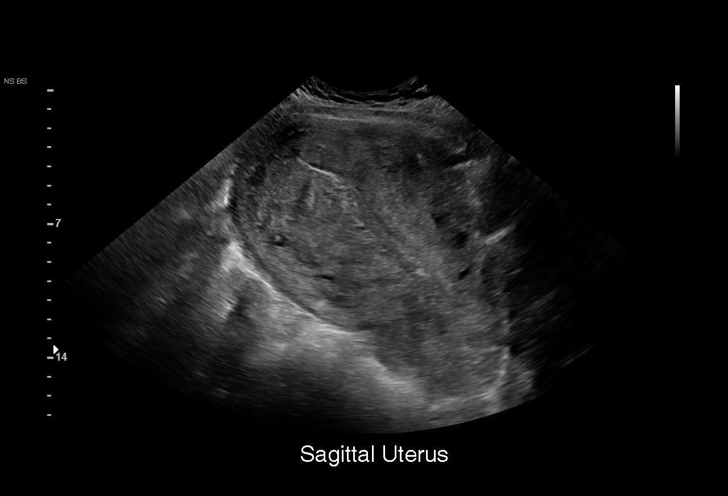
[im 3/27]
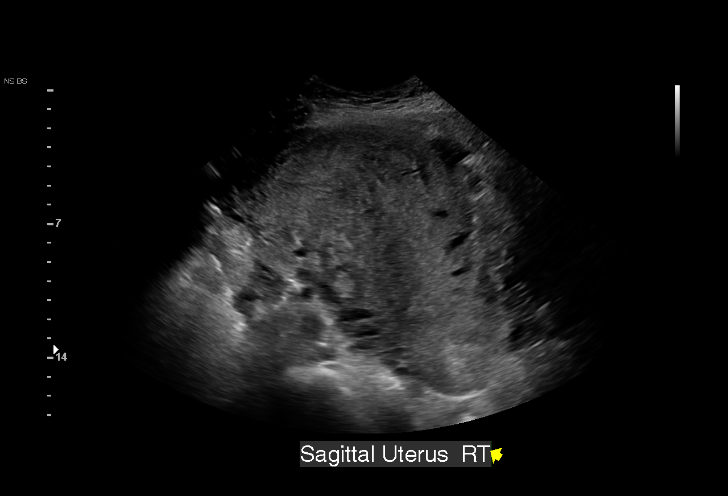
[im 5/27]
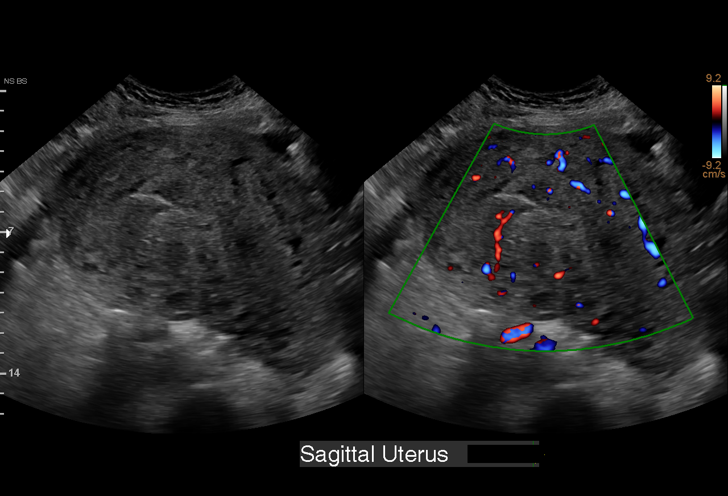
[im 6/27]
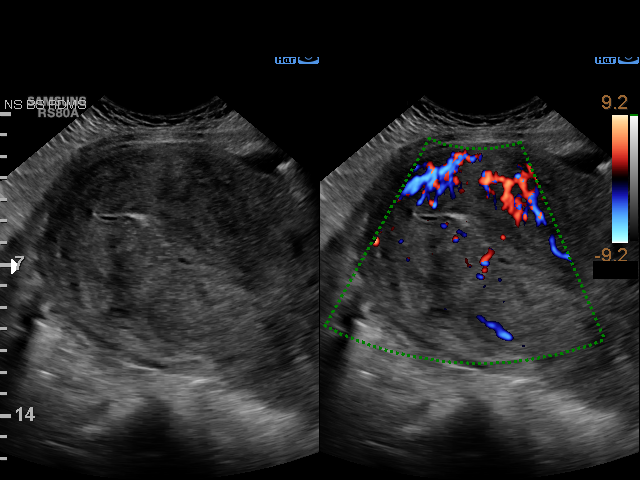
[im 8/27]
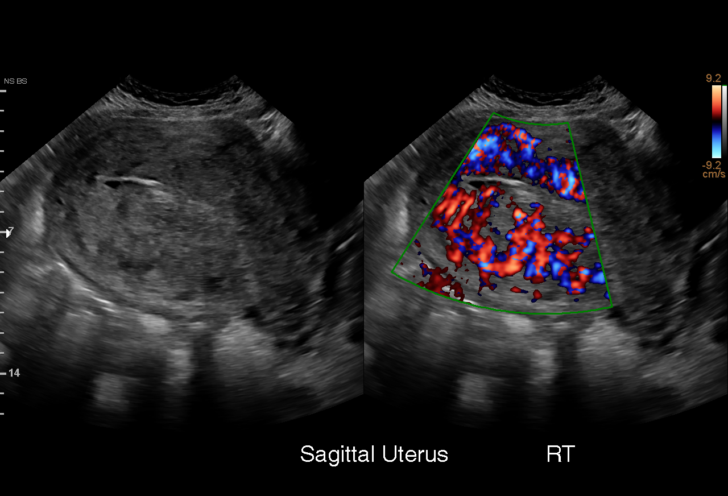
[im 10/27]
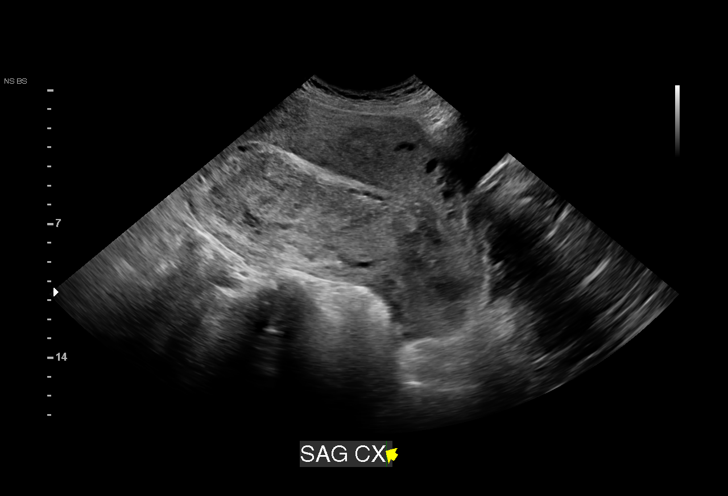
[im 11/27]
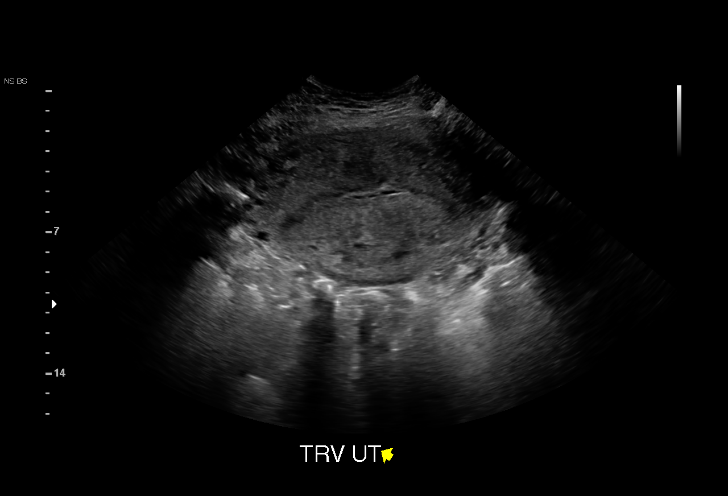
[im 14/27]
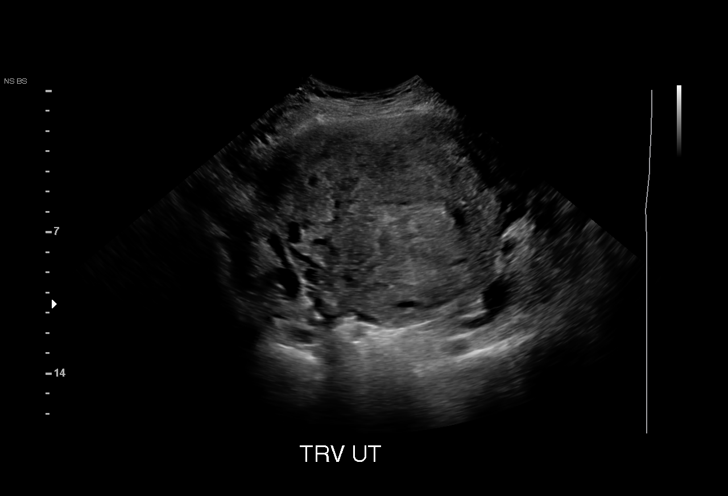
[im 16/27]
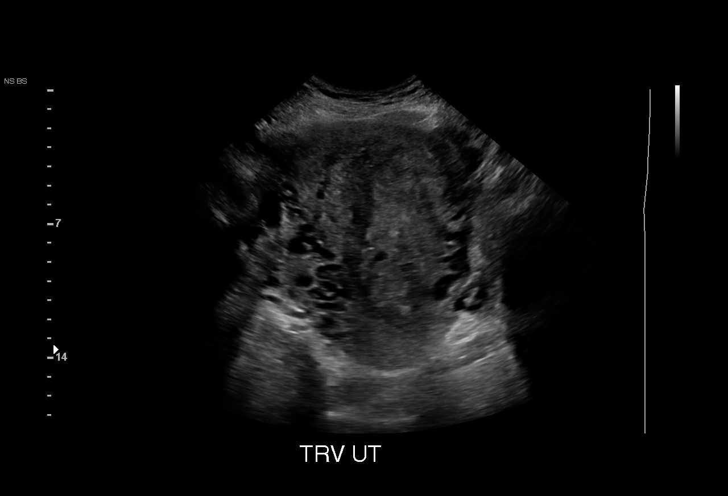
[im 17/27]
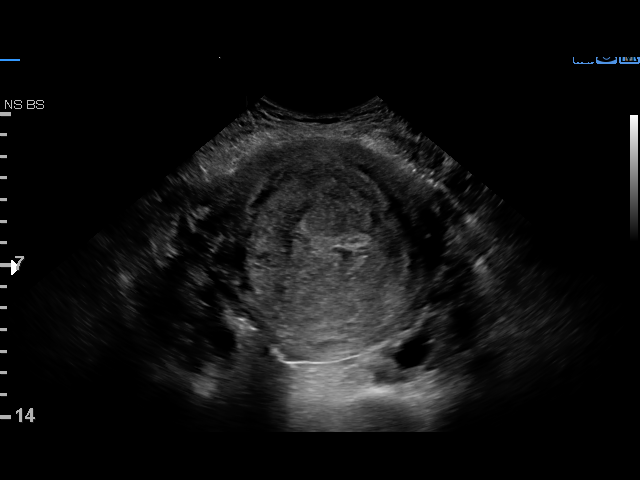
[im 19/27]
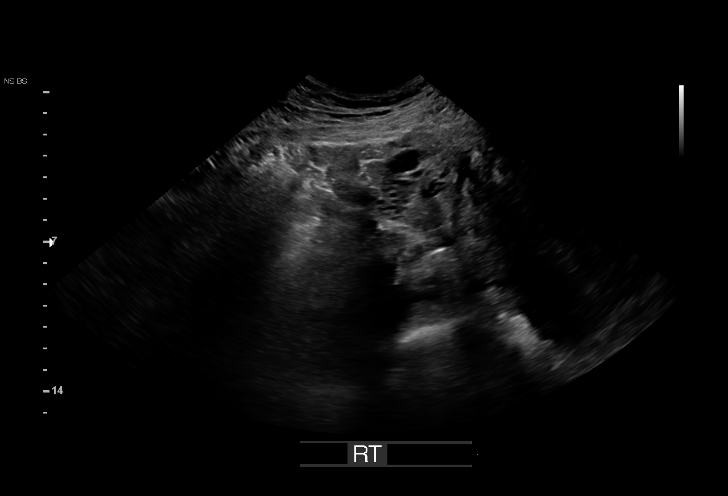
[im 21/27]
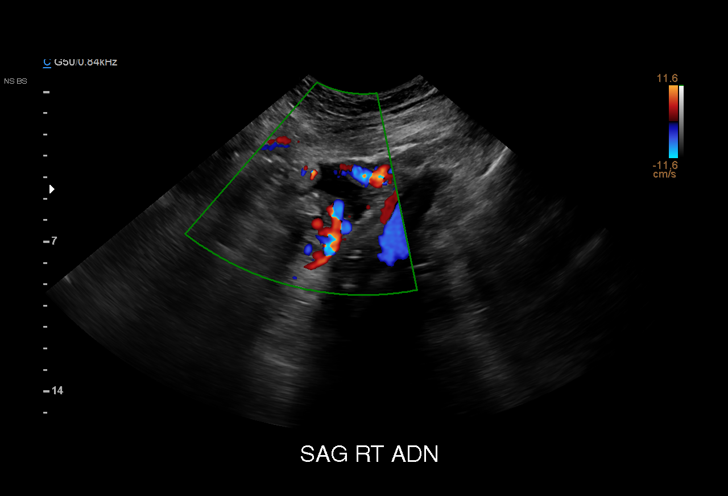
[im 22/27]
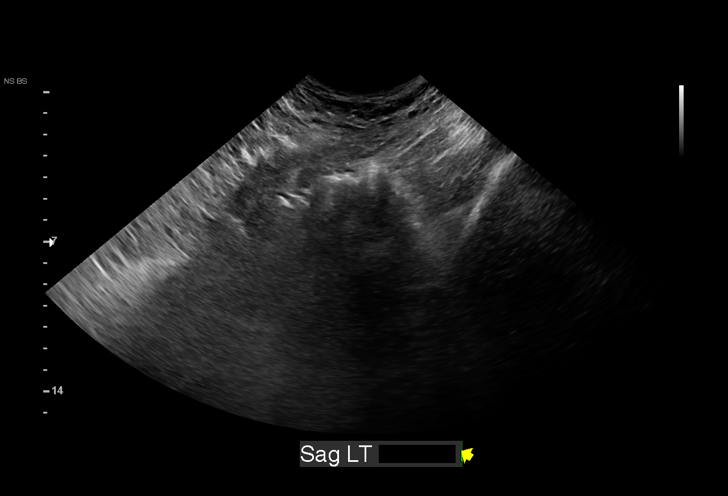
[im 24/27]
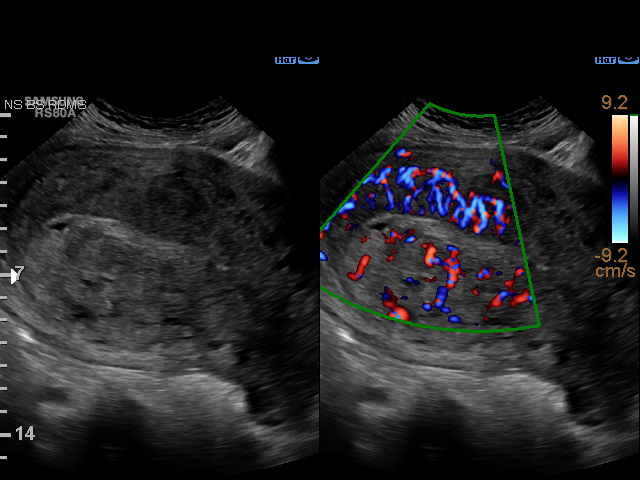
[im 27/27]
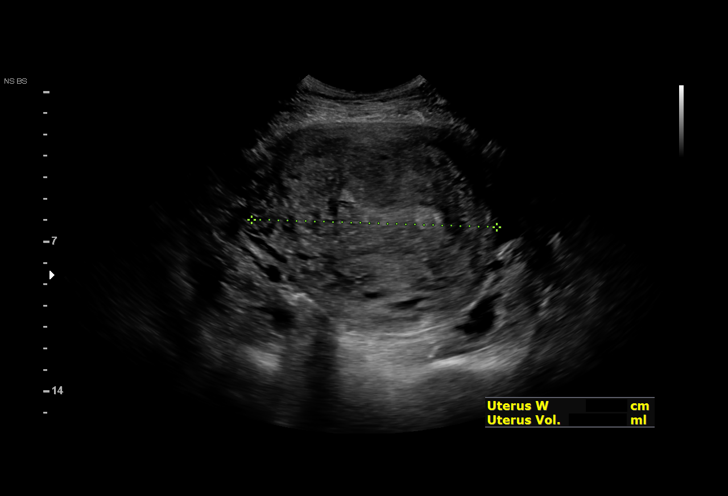

[15 of 25 positions shown; findings below may reference images not displayed]

FINDINGS: Uterus

Measurements: 18.1 x 10.9 x 11.4 cm = volume: 1182.4 mL. Uterus is
enlarged with somewhat globular contour. Heterogeneous echotexture
throughout the uterine myometrium. Prominent vascularity noted
within the uterine myometrium as well. Findings consistent with
recent pregnancy. No discrete fibroid or other mass.

Endometrium

Thickness: 16.1 mm. Scattered areas of vascularity seen within the
endometrial complex. No other focal abnormality. Trace fluid noted
within the endometrial cavity.

Right ovary

Not visualized.  No adnexal mass.

Left ovary

Not visualized.  No adnexal mass.

Other findings: Small volume free fluid noted within the right
adnexa.
IMPRESSION: 1. Enlarged postpartum uterus. Endometrial stripe measures 16 mm in
thickness with scattered areas of internal vascularity. Clinical
correlation for possible retained products of conception
recommended.
2. Nonvisualization of either ovary.  No adnexal mass.
3. No other acute abnormality within the pelvis.

## 2023-12-01 ENCOUNTER — Ambulatory Visit: Admitting: Obstetrics and Gynecology

## 2023-12-01 ENCOUNTER — Encounter: Payer: Self-pay | Admitting: Obstetrics and Gynecology

## 2023-12-01 VITALS — BP 107/72 | HR 96 | Wt 129.0 lb

## 2023-12-01 DIAGNOSIS — N61 Mastitis without abscess: Secondary | ICD-10-CM

## 2023-12-01 MED ORDER — SULFAMETHOXAZOLE-TRIMETHOPRIM 800-160 MG PO TABS
1.0000 | ORAL_TABLET | Freq: Two times a day (BID) | ORAL | 1 refills | Status: AC
Start: 2023-12-01 — End: ?

## 2023-12-01 NOTE — Progress Notes (Signed)
 Pt is breastfeeding her 41 month old and now having some left breast pain, redness and warm to touch. Pt has good milk flow that breast.  Pt states no fever/ chills.

## 2023-12-01 NOTE — Progress Notes (Signed)
  CC: possible mastitis Subjective:    Patient ID: Terri Manning, female    DOB: 1985-07-07, 38 y.o.   MRN: [id number]  HPI 38 yo G5P4 with one day history of left breast tenderness and redness.  Pt is still pumping and breast feeding a ten month old without incident.  Pt denies fever/chills.   Review of Systems     Objective:   Physical Exam Vitals:   12/01/23 1617  BP: 107/72  Pulse: 96  Chest/breast:  obvious erythematous area in medial area of left breast. Area is slightly warm to the touch and not fluctuant. Affected area is tender. Pictures taken and sent to media.       Assessment & Plan:   1. Mastitis (Primary) Pt has never taken dicloxacillin, but similar drug caused hallucinations.  Pt will be treated with Bactrim DS x 7 days. Advised cold compress for comfort with alternating tylenol  and ibuprofen  as anti inflammatories. F/u in 1 week.  If symptoms worsen, pt advised to be seen in [facility] for reevaluation and possible IV abx if needed.  - sulfamethoxazole-trimethoprim (BACTRIM DS) 800-160 MG tablet; Take 1 tablet by mouth 2 (two) times daily.  Dispense: 14 tablet; Refill: 1    Abigail Abler, MD Faculty Attending, [facility]

## 2023-12-08 ENCOUNTER — Ambulatory Visit: Admitting: Obstetrics and Gynecology
